# Patient Record
Sex: Female | Born: 1975 | Race: Black or African American | Hispanic: No | Marital: Single | Smoking: Former smoker
Health system: Southern US, Community
[De-identification: ages and names within clinical notes are randomized; demographics above are authoritative.]

## PROBLEM LIST (undated history)

## (undated) DIAGNOSIS — N920 Excessive and frequent menstruation with regular cycle: Secondary | ICD-10-CM

## (undated) DIAGNOSIS — IMO0001 Reserved for inherently not codable concepts without codable children: Secondary | ICD-10-CM

## (undated) DIAGNOSIS — Z8619 Personal history of other infectious and parasitic diseases: Secondary | ICD-10-CM

## (undated) DIAGNOSIS — D649 Anemia, unspecified: Secondary | ICD-10-CM

## (undated) DIAGNOSIS — Z973 Presence of spectacles and contact lenses: Secondary | ICD-10-CM

## (undated) DIAGNOSIS — N946 Dysmenorrhea, unspecified: Secondary | ICD-10-CM

## (undated) DIAGNOSIS — T7840XA Allergy, unspecified, initial encounter: Secondary | ICD-10-CM

## (undated) DIAGNOSIS — E039 Hypothyroidism, unspecified: Secondary | ICD-10-CM

## (undated) DIAGNOSIS — IMO0002 Reserved for concepts with insufficient information to code with codable children: Secondary | ICD-10-CM

## (undated) HISTORY — PX: THUMB ARTHROSCOPY: SHX2509

## (undated) HISTORY — DX: Allergy, unspecified, initial encounter: T78.40XA

## (undated) HISTORY — PX: TUBAL LIGATION: SHX77

## (undated) HISTORY — PX: FOOT SURGERY: SHX648

---

## 1998-08-23 ENCOUNTER — Encounter: Payer: Self-pay | Admitting: Emergency Medicine

## 1998-08-23 ENCOUNTER — Emergency Department (HOSPITAL_COMMUNITY): Admission: EM | Admit: 1998-08-23 | Discharge: 1998-08-23 | Payer: Self-pay | Admitting: Emergency Medicine

## 1999-08-14 ENCOUNTER — Emergency Department (HOSPITAL_COMMUNITY): Admission: EM | Admit: 1999-08-14 | Discharge: 1999-08-14 | Payer: Self-pay | Admitting: Emergency Medicine

## 1999-12-25 ENCOUNTER — Other Ambulatory Visit: Admission: RE | Admit: 1999-12-25 | Discharge: 1999-12-25 | Payer: Self-pay | Admitting: Family Medicine

## 2000-02-12 ENCOUNTER — Emergency Department (HOSPITAL_COMMUNITY): Admission: EM | Admit: 2000-02-12 | Discharge: 2000-02-12 | Payer: Self-pay | Admitting: *Deleted

## 2000-02-25 ENCOUNTER — Emergency Department (HOSPITAL_COMMUNITY): Admission: EM | Admit: 2000-02-25 | Discharge: 2000-02-25 | Payer: Self-pay | Admitting: Emergency Medicine

## 2000-05-11 ENCOUNTER — Emergency Department (HOSPITAL_COMMUNITY): Admission: EM | Admit: 2000-05-11 | Discharge: 2000-05-11 | Payer: Self-pay | Admitting: Emergency Medicine

## 2000-09-14 HISTORY — PX: FINGER SURGERY: SHX640

## 2001-02-08 ENCOUNTER — Emergency Department (HOSPITAL_COMMUNITY): Admission: EM | Admit: 2001-02-08 | Discharge: 2001-02-08 | Payer: Self-pay | Admitting: Emergency Medicine

## 2001-03-11 ENCOUNTER — Emergency Department (HOSPITAL_COMMUNITY): Admission: EM | Admit: 2001-03-11 | Discharge: 2001-03-11 | Payer: Self-pay | Admitting: Neurology

## 2001-03-22 ENCOUNTER — Emergency Department (HOSPITAL_COMMUNITY): Admission: EM | Admit: 2001-03-22 | Discharge: 2001-03-22 | Payer: Self-pay | Admitting: Emergency Medicine

## 2001-04-12 ENCOUNTER — Other Ambulatory Visit: Admission: RE | Admit: 2001-04-12 | Discharge: 2001-04-12 | Payer: Self-pay | Admitting: Family Medicine

## 2001-04-13 ENCOUNTER — Encounter: Admission: RE | Admit: 2001-04-13 | Discharge: 2001-05-06 | Payer: Self-pay | Admitting: Orthopedic Surgery

## 2001-12-06 ENCOUNTER — Other Ambulatory Visit: Admission: RE | Admit: 2001-12-06 | Discharge: 2001-12-06 | Payer: Self-pay | Admitting: Obstetrics and Gynecology

## 2002-06-13 ENCOUNTER — Encounter (HOSPITAL_COMMUNITY): Admission: AD | Admit: 2002-06-13 | Discharge: 2002-07-13 | Payer: Self-pay | Admitting: Obstetrics and Gynecology

## 2002-07-18 ENCOUNTER — Inpatient Hospital Stay (HOSPITAL_COMMUNITY): Admission: AD | Admit: 2002-07-18 | Discharge: 2002-07-19 | Payer: Self-pay | Admitting: Obstetrics and Gynecology

## 2002-08-16 ENCOUNTER — Other Ambulatory Visit: Admission: RE | Admit: 2002-08-16 | Discharge: 2002-08-16 | Payer: Self-pay | Admitting: Obstetrics and Gynecology

## 2002-09-13 ENCOUNTER — Inpatient Hospital Stay (HOSPITAL_COMMUNITY): Admission: AD | Admit: 2002-09-13 | Discharge: 2002-09-13 | Payer: Self-pay | Admitting: Obstetrics and Gynecology

## 2002-12-11 ENCOUNTER — Inpatient Hospital Stay (HOSPITAL_COMMUNITY): Admission: AD | Admit: 2002-12-11 | Discharge: 2002-12-11 | Payer: Self-pay | Admitting: Obstetrics and Gynecology

## 2003-03-26 ENCOUNTER — Inpatient Hospital Stay (HOSPITAL_COMMUNITY): Admission: AD | Admit: 2003-03-26 | Discharge: 2003-03-26 | Payer: Self-pay | Admitting: Obstetrics and Gynecology

## 2003-06-19 ENCOUNTER — Inpatient Hospital Stay (HOSPITAL_COMMUNITY): Admission: AD | Admit: 2003-06-19 | Discharge: 2003-06-19 | Payer: Self-pay | Admitting: Obstetrics and Gynecology

## 2003-09-15 DIAGNOSIS — Z8639 Personal history of other endocrine, nutritional and metabolic disease: Secondary | ICD-10-CM | POA: Insufficient documentation

## 2003-09-15 HISTORY — DX: Personal history of other endocrine, nutritional and metabolic disease: Z86.39

## 2003-09-17 ENCOUNTER — Encounter (HOSPITAL_COMMUNITY): Admission: RE | Admit: 2003-09-17 | Discharge: 2003-12-16 | Payer: Self-pay | Admitting: Family Medicine

## 2004-02-18 ENCOUNTER — Other Ambulatory Visit: Admission: RE | Admit: 2004-02-18 | Discharge: 2004-02-18 | Payer: Self-pay | Admitting: Obstetrics and Gynecology

## 2004-10-05 ENCOUNTER — Emergency Department (HOSPITAL_COMMUNITY): Admission: EM | Admit: 2004-10-05 | Discharge: 2004-10-05 | Payer: Self-pay | Admitting: Emergency Medicine

## 2004-11-07 ENCOUNTER — Emergency Department (HOSPITAL_COMMUNITY): Admission: EM | Admit: 2004-11-07 | Discharge: 2004-11-07 | Payer: Self-pay | Admitting: Emergency Medicine

## 2005-03-13 ENCOUNTER — Emergency Department (HOSPITAL_COMMUNITY): Admission: EM | Admit: 2005-03-13 | Discharge: 2005-03-14 | Payer: Self-pay | Admitting: Emergency Medicine

## 2005-06-25 ENCOUNTER — Ambulatory Visit (HOSPITAL_COMMUNITY): Admission: RE | Admit: 2005-06-25 | Discharge: 2005-06-25 | Payer: Self-pay | Admitting: Endocrinology

## 2005-08-10 ENCOUNTER — Emergency Department (HOSPITAL_COMMUNITY): Admission: EM | Admit: 2005-08-10 | Discharge: 2005-08-11 | Payer: Self-pay | Admitting: Emergency Medicine

## 2005-08-17 ENCOUNTER — Encounter: Admission: RE | Admit: 2005-08-17 | Discharge: 2005-10-27 | Payer: Self-pay | Admitting: Sports Medicine

## 2005-09-21 ENCOUNTER — Other Ambulatory Visit: Admission: RE | Admit: 2005-09-21 | Discharge: 2005-09-21 | Payer: Self-pay | Admitting: Obstetrics and Gynecology

## 2005-10-12 ENCOUNTER — Emergency Department (HOSPITAL_COMMUNITY): Admission: EM | Admit: 2005-10-12 | Discharge: 2005-10-12 | Payer: Self-pay | Admitting: Emergency Medicine

## 2005-10-15 DIAGNOSIS — E890 Postprocedural hypothyroidism: Secondary | ICD-10-CM | POA: Insufficient documentation

## 2005-10-15 HISTORY — DX: Postprocedural hypothyroidism: E89.0

## 2005-10-19 ENCOUNTER — Encounter (HOSPITAL_COMMUNITY): Admission: RE | Admit: 2005-10-19 | Discharge: 2006-01-17 | Payer: Self-pay | Admitting: Endocrinology

## 2005-12-01 ENCOUNTER — Emergency Department (HOSPITAL_COMMUNITY): Admission: EM | Admit: 2005-12-01 | Discharge: 2005-12-01 | Payer: Self-pay | Admitting: Emergency Medicine

## 2006-01-30 ENCOUNTER — Emergency Department (HOSPITAL_COMMUNITY): Admission: EM | Admit: 2006-01-30 | Discharge: 2006-01-30 | Payer: Self-pay | Admitting: Emergency Medicine

## 2008-10-06 ENCOUNTER — Emergency Department (HOSPITAL_COMMUNITY): Admission: EM | Admit: 2008-10-06 | Discharge: 2008-10-06 | Payer: Self-pay | Admitting: Emergency Medicine

## 2009-03-03 ENCOUNTER — Emergency Department (HOSPITAL_COMMUNITY): Admission: EM | Admit: 2009-03-03 | Discharge: 2009-03-03 | Payer: Self-pay | Admitting: Emergency Medicine

## 2009-06-06 ENCOUNTER — Other Ambulatory Visit: Admission: RE | Admit: 2009-06-06 | Discharge: 2009-06-06 | Payer: Self-pay | Admitting: Radiology

## 2010-04-10 ENCOUNTER — Emergency Department (HOSPITAL_COMMUNITY): Admission: EM | Admit: 2010-04-10 | Discharge: 2010-04-10 | Payer: Self-pay | Admitting: Emergency Medicine

## 2010-05-06 ENCOUNTER — Emergency Department (HOSPITAL_COMMUNITY): Admission: EM | Admit: 2010-05-06 | Discharge: 2010-05-06 | Payer: Self-pay | Admitting: Emergency Medicine

## 2010-07-20 ENCOUNTER — Inpatient Hospital Stay (HOSPITAL_COMMUNITY)
Admission: AD | Admit: 2010-07-20 | Discharge: 2010-07-20 | Payer: Self-pay | Source: Home / Self Care | Admitting: Obstetrics and Gynecology

## 2010-10-04 ENCOUNTER — Encounter: Payer: Self-pay | Admitting: Endocrinology

## 2010-11-10 ENCOUNTER — Inpatient Hospital Stay (HOSPITAL_COMMUNITY)
Admission: AD | Admit: 2010-11-10 | Discharge: 2010-11-11 | Disposition: A | Discharge status: Home / Self Care | Payer: Medicaid Other | Source: Ambulatory Visit | Attending: Obstetrics and Gynecology | Admitting: Obstetrics and Gynecology

## 2010-11-10 DIAGNOSIS — A499 Bacterial infection, unspecified: Secondary | ICD-10-CM | POA: Insufficient documentation

## 2010-11-10 DIAGNOSIS — O2390 Unspecified genitourinary tract infection in pregnancy, unspecified trimester: Secondary | ICD-10-CM | POA: Insufficient documentation

## 2010-11-10 DIAGNOSIS — B9689 Other specified bacterial agents as the cause of diseases classified elsewhere: Secondary | ICD-10-CM | POA: Insufficient documentation

## 2010-11-10 DIAGNOSIS — N760 Acute vaginitis: Secondary | ICD-10-CM | POA: Insufficient documentation

## 2010-11-10 DIAGNOSIS — O4700 False labor before 37 completed weeks of gestation, unspecified trimester: Secondary | ICD-10-CM | POA: Insufficient documentation

## 2010-11-11 LAB — URINALYSIS, ROUTINE W REFLEX MICROSCOPIC
Hgb urine dipstick: NEGATIVE
Ketones, ur: NEGATIVE mg/dL
Protein, ur: NEGATIVE mg/dL
Specific Gravity, Urine: <1.005 — ABNORMAL LOW (ref 1.005–1.030)
Urobilinogen, UA: 0.2 mg/dL (ref 0.0–1.0)

## 2010-11-11 LAB — WET PREP, GENITAL: Yeast Wet Prep HPF POC: NONE SEEN

## 2010-11-11 LAB — GC/CHLAMYDIA PROBE AMP, GENITAL: GC Probe Amp, Genital: NEGATIVE

## 2010-11-12 LAB — STREP B DNA PROBE: Strep Group B Ag: NEGATIVE

## 2010-11-28 LAB — GC/CHLAMYDIA PROBE AMP, GENITAL
Chlamydia, DNA Probe: NEGATIVE
GC Probe Amp, Genital: NEGATIVE

## 2010-11-28 LAB — DIFFERENTIAL
Basophils Absolute: 0.1 10*3/uL (ref 0.0–0.1)
Basophils Relative: 1 % (ref 0–1)
Eosinophils Absolute: 0.2 10*3/uL (ref 0.0–0.7)
Eosinophils Relative: 2 % (ref 0–5)
Lymphocytes Relative: 24 % (ref 12–46)
Lymphs Abs: 2 10*3/uL (ref 0.7–4.0)
Monocytes Absolute: 0.6 10*3/uL (ref 0.1–1.0)
Monocytes Relative: 7 % (ref 3–12)
Neutro Abs: 5.3 10*3/uL (ref 1.7–7.7)
Neutrophils Relative %: 66 % (ref 43–77)

## 2010-11-28 LAB — CBC
HCT: 35.1 % — ABNORMAL LOW (ref 36.0–46.0)
Hemoglobin: 12.3 g/dL (ref 12.0–15.0)
MCH: 34.3 pg — ABNORMAL HIGH (ref 26.0–34.0)
MCHC: 35 g/dL (ref 30.0–36.0)
MCV: 98.1 fL (ref 78.0–100.0)
Platelets: 216 10*3/uL (ref 150–400)
RBC: 3.58 MIL/uL — ABNORMAL LOW (ref 3.87–5.11)
RDW: 12 % (ref 11.5–15.5)
WBC: 8.1 10*3/uL (ref 4.0–10.5)

## 2010-11-28 LAB — WET PREP, GENITAL: Trich, Wet Prep: NONE SEEN

## 2010-11-28 LAB — ABO/RH: ABO/RH(D): O POS

## 2010-11-28 LAB — URINALYSIS, ROUTINE W REFLEX MICROSCOPIC
Bilirubin Urine: NEGATIVE
Glucose, UA: NEGATIVE mg/dL
Hgb urine dipstick: NEGATIVE
Ketones, ur: NEGATIVE mg/dL
Nitrite: NEGATIVE
Protein, ur: NEGATIVE mg/dL
Specific Gravity, Urine: 1.009 (ref 1.005–1.030)
Urobilinogen, UA: 0.2 mg/dL (ref 0.0–1.0)
pH: 7.5 (ref 5.0–8.0)

## 2010-11-28 LAB — POCT PREGNANCY, URINE: Preg Test, Ur: POSITIVE

## 2010-11-28 LAB — HCG, QUANTITATIVE, PREGNANCY: hCG, Beta Chain, Quant, S: 187820 m[IU]/mL — ABNORMAL HIGH (ref <5)

## 2010-11-29 LAB — POCT PREGNANCY, URINE: Preg Test, Ur: NEGATIVE

## 2010-11-29 LAB — GC/CHLAMYDIA PROBE AMP, GENITAL: Chlamydia, DNA Probe: NEGATIVE

## 2010-11-29 LAB — URINALYSIS, ROUTINE W REFLEX MICROSCOPIC
Bilirubin Urine: NEGATIVE
Nitrite: NEGATIVE
Specific Gravity, Urine: 1.023 (ref 1.005–1.030)
pH: 7.5 (ref 5.0–8.0)

## 2010-11-29 LAB — WET PREP, GENITAL: Yeast Wet Prep HPF POC: NONE SEEN

## 2010-12-19 ENCOUNTER — Inpatient Hospital Stay (HOSPITAL_COMMUNITY): Admission: AD | Admit: 2010-12-19 | Payer: Self-pay | Source: Home / Self Care | Admitting: Obstetrics & Gynecology

## 2010-12-22 ENCOUNTER — Other Ambulatory Visit: Payer: Self-pay

## 2010-12-22 ENCOUNTER — Inpatient Hospital Stay (HOSPITAL_COMMUNITY)
Admission: AD | Admit: 2010-12-22 | Discharge: 2010-12-24 | DRG: 374 | Disposition: A | Discharge status: Home / Self Care | Payer: BC Managed Care – PPO | Source: Ambulatory Visit | Attending: Obstetrics and Gynecology | Admitting: Obstetrics and Gynecology

## 2010-12-22 DIAGNOSIS — Z302 Encounter for sterilization: Secondary | ICD-10-CM

## 2010-12-22 HISTORY — PX: TUBAL LIGATION: SHX77

## 2010-12-22 LAB — CBC
HCT: 36.4 % (ref 36.0–46.0)
MCHC: 34.3 g/dL (ref 30.0–36.0)
MCV: 95.5 fL (ref 78.0–100.0)
RDW: 13.8 % (ref 11.5–15.5)

## 2010-12-23 LAB — CBC
MCH: 32.3 pg (ref 26.0–34.0)
MCHC: 33.2 g/dL (ref 30.0–36.0)
MCV: 97.1 fL (ref 78.0–100.0)
Platelets: 167 10*3/uL (ref 150–400)
RDW: 14 % (ref 11.5–15.5)

## 2011-01-22 NOTE — Op Note (Signed)
  NAMECAROLENA, Susan Schmitt               ACCOUNT NO.:  [id number]  MEDICAL RECORD NO.:  [id number]           PATIENT TYPE:  LOCATION:                                 FACILITY:  PHYSICIAN:  Janine Limbo, M.D.DATE OF BIRTH:  Aug 03, 1976  DATE OF PROCEDURE:  12/22/2010 DATE OF DISCHARGE:                              OPERATIVE REPORT   PREOPERATIVE DIAGNOSES: 1. Postpartum day 0. 2. Desires sterilization.  POSTOPERATIVE DIAGNOSES: 1. Postpartum day 0. 2. Desires sterilization.  PROCEDURE:  Postpartum bilateral tubal ligation.  SURGEON:  Janine Limbo, M.D.  FIRST ASSISTANT:  None.  ANESTHETIC:  Epidural.  DISPOSITION:  [name] is a 35 year old female, now para 3-0-5-3, who had a vaginal delivery of a healthy female infant today.  She desires permanent sterilization.  She understands the indications for her surgical procedure and she accepts the risks of, but not limited to, anesthetic complications, bleeding, infection, and possible damage to the surrounding organs.  She understands that there is a small but real failure rate associated with tubal sterilization (7 to 10 the patients per 1000).  FINDINGS:  The fallopian tubes were normal bilaterally.  There was some thinning noted at the umbilicus but no hernia was appreciated.  PROCEDURE:  The patient was taken to the operating room where it was determined that the epidural she had received for labor would be adequate for her tubal sterilization.  The patient's abdomen was prepped with multiple layers of ChloraPrep and then sterilely draped.  The subumbilical area was injected with 5 mL of 0.5% Marcaine with epinephrine.  A subumbilical incision was made and carried sharply through the subcutaneous tissue, the fascia, and the anterior peritoneum.  The left fallopian tube was identified and followed to its fimbriated end.  A knuckle of tube was made on the left using two free ties of 0 plain catgut suture.  The  knuckle of tube thus made was excised.  Hemostasis was adequate.  An identical procedure was carried out on the opposite side.  Again, hemostasis was adequate.  The instruments were removed.  The fascia and the anterior peritoneum were closed using a running suture of 2-0 Vicryl.  The skin was reapproximated using a subcuticular suture of 3-0 Monocryl.  Sponge, needle, and instrument counts were correct.  The estimated blood loss was 2 mL. The patient tolerated her procedure well.  She was transported to the recovery room in stable condition.  The portion of the left tube and a portion of the right tube were sent to Pathology for evaluation.     Janine Limbo, M.D.     AVS/MEDQ  D:  12/22/2010  T:  12/23/2010  Job:  [id number]  Electronically Signed by Kirkland Hun M.D. on 01/22/2011 03:12:09 AM

## 2011-01-22 NOTE — Discharge Summary (Signed)
[name], Schmitt               ACCOUNT NO.:  [id number]  MEDICAL RECORD NO.:  [id number]           PATIENT TYPE:  I  LOCATION:  9119                          FACILITY:  [facility]  PHYSICIAN:  Osborn Coho, M.D.   DATE OF BIRTH:  July 05, 1976  DATE OF ADMISSION:  12/22/2010 DATE OF DISCHARGE:  12/24/2010                              DISCHARGE SUMMARY   ADMITTING DIAGNOSES: 1. Intrauterine pregnancy at 40-3/7 weeks. 2. Group B strep negative. 3. History of herpes simplex virus II. 4. Desires tubal sterilization. 5. Multiple TABs.  DISCHARGE DIAGNOSES: 1. Intrauterine pregnancy at 40-3/7 weeks. 2. Group B strep negative. 3. History of herpes simplex virus II. 4. Desires tubal sterilization. 5. Multiple TABs.  PROCEDURES: 1. Normal spontaneous vaginal delivery. 2. Postpartum tubal sterilization.  HOSPITAL COURSE:  [name] is a 35 year old, gravida 8, para 2-0-5-2 who presented 40-3/7 weeks early on the morning of December 22, 2010, with contractions approximately 2 hours.  Her pregnancy had been remarkable for: 1. Multiple TABs. 2. Thyroid disease with the patient on Synthroid 3. History of HSV II with no recent or current lesions. 4. First trimester spotting. 5. Group B strep negative. 6. Desires tubal sterilization with consent on chart.  On arrival, cervix was 3, 80% vertex -1.  No HSV lesions or prodrome were noted.  The patient planned an epidural.  This was placed.  The patient had a 9-minute deceleration subsequent to an episode of  tachysystole.  At that time, she was 5, 90% vertex, -1 with bulging bag of water.  Fetal heart rate resolved to a normal pattern.  Pitocin was begun secondary to inadequate labor pattern.  She then became completely dilated at 9:38, pushing began at 9:45, and she delivered at 10:12.  She had a viable female by the name of Susan Schmitt.  Weight 7 pounds and 14 ounces, Apgars were 7 and 9.  Infant was taken to the Full-Term Nursery.  Mother was  taken to recovery in good condition.  There were no lacerations noted.  There was some bogginess of the uterus, so Cytotec 1000 mcg was placed in the rectum.  Placenta was also sent to Pathology for a questionable velamentous insertion.  Dr. Stefano Gaul then saw the patient subsequent to her delivery, and the decision was made to proceed with tubal sterilization. This was done on December 22, 2010, by Dr. Stefano Gaul under existing epidural anesthesia.  The patient tolerated the procedure well and was taken to recovery in good condition.  On postpartum day #1, postop day #1, the patient was doing well.  She was up ad lib, tolerating a regular diet.  Hemoglobin was 11.1, down from 12.5.  White blood cell count was 10.5 and platelet count was 167.  Her incision was clean, dry, and intact.  She was breast-feeding well.  By the next day, she was continuing to do well.  She was up ad lib.  Baby was feeding well.  The patient's incision was within normal limits.  She was deemed to receive full benefit of hospital stay and was discharged home.  DISCHARGE INSTRUCTIONS:  Per [facility] handout.  DISCHARGE MEDICATIONS: 1. Motrin 600 mg p.o. q.6 h. p.r.n. pain. 2. Percocet 5/325 one to two p.o. q.3-4 h. p.r.n. pain.  DISCHARGE FOLLOWUP:  Will occur in 6 weeks at [facility].     Susan Schmitt Susan Schmitt, C.N.M.   ______________________________ Osborn Coho, M.D.    VLL/MEDQ  D:  12/26/2010  T:  12/27/2010  Job:  [id number]  Electronically Signed by Nigel Bridgeman C.N.M. on 12/27/2010 11:06:13 AM Electronically Signed by Osborn Coho M.D. on 01/22/2011 07:38:33 AM

## 2011-09-05 ENCOUNTER — Emergency Department (HOSPITAL_COMMUNITY)
Admission: EM | Admit: 2011-09-05 | Discharge: 2011-09-05 | Disposition: A | Discharge status: Home / Self Care | Payer: Medicaid Other | Attending: Emergency Medicine | Admitting: Emergency Medicine

## 2011-09-05 DIAGNOSIS — Z0000 Encounter for general adult medical examination without abnormal findings: Secondary | ICD-10-CM

## 2011-09-05 HISTORY — DX: Reserved for inherently not codable concepts without codable children: IMO0001

## 2011-09-05 HISTORY — DX: Hypothyroidism, unspecified: E03.9

## 2011-09-05 HISTORY — DX: Reserved for concepts with insufficient information to code with codable children: IMO0002

## 2011-09-05 NOTE — ED Notes (Signed)
Pt requesting ETOH level. Pt has breathalyzer in vehicle which registered etoh on board today at 3:40pm.  Pt states has not drank in over 1 year.  Pt  Wants etoh to show negative.  Went to police station and they referred to ER for testing.

## 2011-09-05 NOTE — ED Provider Notes (Signed)
History     CSN: [id number]  Arrival date & time 09/05/11  1825   First MD Initiated Contact with Patient 09/05/11 1945      Chief Complaint  Patient presents with  . Labs Only    (Consider location/radiation/quality/duration/timing/severity/associated sxs/prior treatment) The history is provided by the patient.  Pt is a 35yo female who is here with a complaint of an abnormal lab. Pt states she has abreathalyzer in her vihicle for prevous DUI. States she was out running errands when she blew positive for alcohol. Pt denies drinking. States she has not had a sip of alcohol in over a year. States that later she blew negative. States that she went to a police station and they told her to come here to have alcohol tested. Pt has no other complaints.   Past Medical History  Diagnosis Date  . Hypothyroid   . Radiation     thyroid    Past Surgical History  Procedure Date  . Thumb arthroscopy   . Tubal ligation     History reviewed. No pertinent family history.  History  Substance Use Topics  . Smoking status: Former Games developer  . Smokeless tobacco: Not on file  . Alcohol Use: No    OB History    Grav Para Term Preterm Abortions TAB SAB Ect Mult Living                  Review of Systems  All other systems reviewed and are negative.    Allergies  Review of patient's allergies indicates no known allergies.  Home Medications   Current Outpatient Rx  Name Route Sig Dispense Refill  . LEVOTHYROXINE SODIUM 100 MCG PO TABS Oral Take 100 mcg by mouth daily.      Marland Kitchen HAIR/SKIN/NAILS PO Oral Take 1 tablet by mouth daily.      . TERBINAFINE HCL 250 MG PO TABS Oral Take 250 mg by mouth 2 (two) times daily.        BP 113/80  Pulse 82  Temp(Src) 98.7 F (37.1 C) (Oral)  Resp 18  SpO2 100%  LMP 08/03/2011  Physical Exam  Constitutional: She is oriented to person, place, and time. She appears well-developed and well-nourished. No distress.  HENT:  Head:  Normocephalic.  Neck: Neck supple.  Cardiovascular: Normal rate, regular rhythm and normal heart sounds.   Pulmonary/Chest: Effort normal and breath sounds normal. No respiratory distress.  Neurological: She is alert and oriented to person, place, and time.  Skin: Skin is warm and dry.  Psychiatric: She has a normal mood and affect. Her behavior is normal. Judgment and thought content normal.    ED Course  Procedures (including critical care time)  Pt's alcohol here is negative. No sure how beneficial thsi would be since she blew it positivie around 4pm, and it is 10pm now. Will d/c home with results.   MDM          Lottie Mussel, PA 09/05/11 2209

## 2011-09-06 NOTE — ED Provider Notes (Signed)
Medical screening examination/treatment/procedure(s) were performed by non-physician practitioner and as supervising physician I was immediately available for consultation/collaboration.   Lyanne Co, MD 09/06/11 0000

## 2013-05-17 ENCOUNTER — Encounter (HOSPITAL_COMMUNITY): Payer: Self-pay

## 2013-05-17 ENCOUNTER — Emergency Department (HOSPITAL_COMMUNITY)
Admission: EM | Admit: 2013-05-17 | Discharge: 2013-05-18 | Disposition: A | Discharge status: Home / Self Care | Payer: BC Managed Care – PPO | Attending: Emergency Medicine | Admitting: Emergency Medicine

## 2013-05-17 DIAGNOSIS — Z3202 Encounter for pregnancy test, result negative: Secondary | ICD-10-CM | POA: Insufficient documentation

## 2013-05-17 DIAGNOSIS — R1032 Left lower quadrant pain: Secondary | ICD-10-CM | POA: Insufficient documentation

## 2013-05-17 DIAGNOSIS — N898 Other specified noninflammatory disorders of vagina: Secondary | ICD-10-CM | POA: Insufficient documentation

## 2013-05-17 DIAGNOSIS — E039 Hypothyroidism, unspecified: Secondary | ICD-10-CM | POA: Insufficient documentation

## 2013-05-17 DIAGNOSIS — N949 Unspecified condition associated with female genital organs and menstrual cycle: Secondary | ICD-10-CM | POA: Insufficient documentation

## 2013-05-17 DIAGNOSIS — R110 Nausea: Secondary | ICD-10-CM | POA: Insufficient documentation

## 2013-05-17 DIAGNOSIS — Z87891 Personal history of nicotine dependence: Secondary | ICD-10-CM | POA: Insufficient documentation

## 2013-05-17 DIAGNOSIS — Z79899 Other long term (current) drug therapy: Secondary | ICD-10-CM | POA: Insufficient documentation

## 2013-05-17 DIAGNOSIS — R109 Unspecified abdominal pain: Secondary | ICD-10-CM

## 2013-05-17 LAB — CBC WITH DIFFERENTIAL/PLATELET
Basophils Absolute: 0 10*3/uL (ref 0.0–0.1)
Eosinophils Absolute: 0.2 10*3/uL (ref 0.0–0.7)
Eosinophils Relative: 3 % (ref 0–5)
Lymphs Abs: 3.6 10*3/uL (ref 0.7–4.0)
MCH: 31.3 pg (ref 26.0–34.0)
MCV: 93 fL (ref 78.0–100.0)
Monocytes Absolute: 0.8 10*3/uL (ref 0.1–1.0)
Platelets: 192 10*3/uL (ref 150–400)
RDW: 12 % (ref 11.5–15.5)

## 2013-05-17 LAB — URINALYSIS, ROUTINE W REFLEX MICROSCOPIC
Bilirubin Urine: NEGATIVE
Hgb urine dipstick: NEGATIVE
Protein, ur: NEGATIVE mg/dL
Urobilinogen, UA: 1 mg/dL (ref 0.0–1.0)

## 2013-05-17 MED ORDER — ACETAMINOPHEN 325 MG PO TABS
650.0000 mg | ORAL_TABLET | Freq: Once | ORAL | Status: AC
  Administered 2013-05-18: 650 mg via ORAL
  Filled 2013-05-17: qty 1

## 2013-05-17 NOTE — ED Provider Notes (Signed)
CSN: [id number]     Arrival date & time 05/17/13  2210 History   This chart was scribed for Rhea Bleacher, PA, working with Audree Camel, MD by Blanchard Kelch, ED Scribe. This patient was seen in room WA15/WA15 and the patient's care was started at 11:28 PM.     Chief Complaint  Patient presents with  . Abdominal Pain    Patient is a 37 y.o. female presenting with abdominal pain. The history is provided by the patient. No language interpreter was used.  Abdominal Pain Associated symptoms: nausea   Associated symptoms: no chest pain, no cough, no diarrhea, no dysuria, no fever, no sore throat, no vaginal bleeding, no vaginal discharge and no vomiting     HPI Comments: Susan Schmitt is a 37 y.o. female who presents to the Emergency Department complaining of constant, worsening left lower abdominal pain that began a few days ago. Patient reports her husband was cheating on her previously and she claims to have caught an STD due to the presence of vaginal discharge and an odor before the abdominal pain began. Patient was given medicine to treat bacterial vaginosis but she wasn't tested for STD and a pelvic exam was not performed. The patient reports the symptoms cleared up after taking the prescribed medication. She denies fever, nausea, vomiting, dysuria, or current vaginal discharge. She denies taking any medications to relieve the pain. She has not had any prior surgeries to the abdomen. The onset of this condition was acute. The course is constant. Aggravating factors: none. Alleviating factors: none.      Past Medical History  Diagnosis Date  . Hypothyroid   . Radiation     thyroid   Past Surgical History  Procedure Laterality Date  . Thumb arthroscopy    . Tubal ligation     History reviewed. No pertinent family history. History  Substance Use Topics  . Smoking status: Former Games developer  . Smokeless tobacco: Not on file  . Alcohol Use: No   OB History   Grav Para Term Preterm  Abortions TAB SAB Ect Mult Living                 Review of Systems  Constitutional: Negative for fever.  HENT: Negative for sore throat and rhinorrhea.   Eyes: Negative for redness.  Respiratory: Negative for cough.   Cardiovascular: Negative for chest pain.  Gastrointestinal: Positive for nausea and abdominal pain. Negative for vomiting and diarrhea.  Genitourinary: Positive for pelvic pain. Negative for dysuria, vaginal bleeding and vaginal discharge.  Musculoskeletal: Negative for myalgias.  Skin: Negative for rash.  Neurological: Negative for headaches.    Allergies  Review of patient's allergies indicates no known allergies.  Home Medications   Current Outpatient Rx  Name  Route  Sig  Dispense  Refill  . levothyroxine (SYNTHROID, LEVOTHROID) 100 MCG tablet   Oral   Take 100 mcg by mouth daily.           . Multiple Vitamins-Minerals (HAIR/SKIN/NAILS PO)   Oral   Take 1 tablet by mouth daily.           Marland Kitchen terbinafine (LAMISIL) 250 MG tablet   Oral   Take 250 mg by mouth 2 (two) times daily.            Triage Vitals: BP 128/86  Pulse 80  Temp(Src) 98.4 F (36.9 C) (Oral)  Resp 20  Ht 5\' 5"  (1.651 m)  Wt 163 lb (73.936 kg)  BMI  27.12 kg/m2  SpO2 100%  LMP 05/08/2013  Physical Exam  Nursing note and vitals reviewed. Constitutional: She appears well-developed and well-nourished.  HENT:  Head: Normocephalic and atraumatic.  Eyes: Conjunctivae are normal. Right eye exhibits no discharge. Left eye exhibits no discharge.  Neck: Normal range of motion. Neck supple.  Cardiovascular: Normal rate, regular rhythm and normal heart sounds.   Pulmonary/Chest: Effort normal and breath sounds normal.  Abdominal: Soft. There is tenderness in the suprapubic area and left lower quadrant. There is no rebound and no guarding.  Genitourinary: There is no rash or tenderness on the right labia. There is no rash or tenderness on the left labia. Uterus is not tender. Cervix  exhibits motion tenderness (mild) and discharge (white). Right adnexum displays no mass and no tenderness. Left adnexum displays no mass and no tenderness. No tenderness around the vagina. Vaginal discharge (thick white) found.  Neurological: She is alert.  Skin: Skin is warm and dry.  Psychiatric: She has a normal mood and affect.    ED Course  Procedures (including critical care time)  DIAGNOSTIC STUDIES: Oxygen Saturation is 100% on room air, normal by my interpretation.    COORDINATION OF CARE:  11:37 PM - Patient verbalizes understanding and agrees with treatment plan.    Labs Review Labs Reviewed  WET PREP, GENITAL - Abnormal; Notable for the following:    WBC, Wet Prep HPF POC FEW (*)    All other components within normal limits  CBC WITH DIFFERENTIAL - Abnormal; Notable for the following:    RBC 3.74 (*)    Hemoglobin 11.7 (*)    HCT 34.8 (*)    All other components within normal limits  GC/CHLAMYDIA PROBE AMP  URINALYSIS, ROUTINE W REFLEX MICROSCOPIC  BASIC METABOLIC PANEL  POCT PREGNANCY, URINE   Imaging Review No results found.  Patient seen and examined. Work-up initiated. Medications ordered.   Vital signs reviewed and are as follows: Filed Vitals:   05/17/13 2219  BP: 128/86  Pulse: 80  Temp: 98.4 F (36.9 C)  Resp: 20   12:56 AM  Pelvic exam performed with tech chaperone. Pt informed of all results.   Offered to treat presumptively for GC/chlamydia and she accepts. Rocephin and azithro given.  Patient counseled on safe sexual practices.  Told them that they should not have sexual contact for next 7 days and that they need to inform sexual partners so that they can get tested and treated as well.  Urged f/u with [place] [facility] for HIV and syphilis testing.  Patient verbalizes understanding and agrees with plan.    Pt's pain is improved. Offered medications for home and she declines.   The patient was urged to return to the Emergency  Department immediately with worsening of current symptoms, worsening abdominal pain, persistent vomiting, blood noted in stools, fever, or any other concerns. The patient verbalized understanding.   Encouraged GYN f/u -- referral given.     MDM   1. Abdominal pain    Patient with abdominal pain. Labs reassuring. Patient at high risk for STD exposure. She was tested and treated for [facility] and Chlamydia. Do not suspect PID given minimal tenderness on exam. Few white blood cells on prep. Do not suspect UA or ovarian torsion. Do not feel pelvic US/CT indicated at this point. Pt appears well and non-toxic.    I personally performed the services described in this documentation, which was scribed in my presence. The recorded information has been reviewed  and is accurate.    Renne Crigler, PA-C 05/18/13 [phone]

## 2013-05-17 NOTE — ED Notes (Signed)
Pt finished treatment on Sunday for potential STD.  Pt symptoms went away.  Pt has been spotting/bleeding for over a week.  Now with abdominal pain to lower abdomen.  Nausea no vomiting no diarrhea.  No fever.

## 2013-05-18 LAB — WET PREP, GENITAL: Clue Cells Wet Prep HPF POC: NONE SEEN

## 2013-05-18 LAB — BASIC METABOLIC PANEL
Calcium: 8.9 mg/dL (ref 8.4–10.5)
Creatinine, Ser: 0.59 mg/dL (ref 0.50–1.10)
GFR calc non Af Amer: >90 mL/min (ref >90)
Glucose, Bld: 99 mg/dL (ref 70–99)
Sodium: 137 mEq/L (ref 135–145)

## 2013-05-18 LAB — GC/CHLAMYDIA PROBE AMP: CT Probe RNA: NEGATIVE

## 2013-05-18 MED ORDER — LIDOCAINE HCL 2 % IJ SOLN
INTRAMUSCULAR | Status: AC
  Filled 2013-05-18: qty 20

## 2013-05-18 MED ORDER — AZITHROMYCIN 250 MG PO TABS
1000.0000 mg | ORAL_TABLET | Freq: Once | ORAL | Status: AC
  Administered 2013-05-18: 1000 mg via ORAL
  Filled 2013-05-18: qty 4

## 2013-05-18 MED ORDER — CEFTRIAXONE SODIUM 250 MG IJ SOLR
250.0000 mg | Freq: Once | INTRAMUSCULAR | Status: AC
  Administered 2013-05-18: 250 mg via INTRAMUSCULAR
  Filled 2013-05-18: qty 250

## 2013-05-18 NOTE — ED Provider Notes (Signed)
Medical screening examination/treatment/procedure(s) were performed by non-physician practitioner and as supervising physician I was immediately available for consultation/collaboration.   Audree Camel, MD 05/18/13 1534

## 2015-04-19 ENCOUNTER — Emergency Department (HOSPITAL_COMMUNITY)
Admission: EM | Admit: 2015-04-19 | Discharge: 2015-04-19 | Disposition: A | Discharge status: Home / Self Care | Payer: Medicaid Other | Attending: Emergency Medicine | Admitting: Emergency Medicine

## 2015-04-19 ENCOUNTER — Encounter (HOSPITAL_COMMUNITY): Payer: Self-pay | Admitting: *Deleted

## 2015-04-19 ENCOUNTER — Emergency Department (HOSPITAL_COMMUNITY): Payer: Medicaid Other

## 2015-04-19 DIAGNOSIS — R0789 Other chest pain: Secondary | ICD-10-CM

## 2015-04-19 DIAGNOSIS — R519 Headache, unspecified: Secondary | ICD-10-CM

## 2015-04-19 DIAGNOSIS — Z3202 Encounter for pregnancy test, result negative: Secondary | ICD-10-CM | POA: Insufficient documentation

## 2015-04-19 DIAGNOSIS — Z87891 Personal history of nicotine dependence: Secondary | ICD-10-CM | POA: Insufficient documentation

## 2015-04-19 DIAGNOSIS — E039 Hypothyroidism, unspecified: Secondary | ICD-10-CM | POA: Insufficient documentation

## 2015-04-19 DIAGNOSIS — Z79899 Other long term (current) drug therapy: Secondary | ICD-10-CM | POA: Insufficient documentation

## 2015-04-19 DIAGNOSIS — M791 Myalgia, unspecified site: Secondary | ICD-10-CM

## 2015-04-19 DIAGNOSIS — R51 Headache: Secondary | ICD-10-CM | POA: Insufficient documentation

## 2015-04-19 LAB — BASIC METABOLIC PANEL
ANION GAP: 9 (ref 5–15)
BUN: 8 mg/dL (ref 6–20)
CALCIUM: 9 mg/dL (ref 8.9–10.3)
CHLORIDE: 105 mmol/L (ref 101–111)
CO2: 23 mmol/L (ref 22–32)
CREATININE: 0.63 mg/dL (ref 0.44–1.00)
GFR calc Af Amer: >60 mL/min (ref >60)
GFR calc non Af Amer: >60 mL/min (ref >60)
Glucose, Bld: 87 mg/dL (ref 65–99)
Potassium: 4.3 mmol/L (ref 3.5–5.1)
SODIUM: 137 mmol/L (ref 135–145)

## 2015-04-19 LAB — CBC
HEMATOCRIT: 37 % (ref 36.0–46.0)
HEMOGLOBIN: 12.6 g/dL (ref 12.0–15.0)
MCH: 31.7 pg (ref 26.0–34.0)
MCHC: 34.1 g/dL (ref 30.0–36.0)
MCV: 93 fL (ref 78.0–100.0)
Platelets: 204 10*3/uL (ref 150–400)
RBC: 3.98 MIL/uL (ref 3.87–5.11)
RDW: 12 % (ref 11.5–15.5)
WBC: 8.8 10*3/uL (ref 4.0–10.5)

## 2015-04-19 LAB — I-STAT BETA HCG BLOOD, ED (MC, WL, AP ONLY): I-stat hCG, quantitative: <5 m[IU]/mL (ref <5)

## 2015-04-19 LAB — TROPONIN I: Troponin I: <0.03 ng/mL (ref <0.031)

## 2015-04-19 MED ORDER — KETOROLAC TROMETHAMINE 30 MG/ML IJ SOLN
30.0000 mg | Freq: Once | INTRAMUSCULAR | Status: AC
  Administered 2015-04-19: 30 mg via INTRAVENOUS
  Filled 2015-04-19: qty 1

## 2015-04-19 MED ORDER — ONDANSETRON HCL 4 MG/2ML IJ SOLN
4.0000 mg | Freq: Once | INTRAMUSCULAR | Status: AC
  Administered 2015-04-19: 4 mg via INTRAVENOUS
  Filled 2015-04-19: qty 2

## 2015-04-19 MED ORDER — SODIUM CHLORIDE 0.9 % IV BOLUS (SEPSIS)
1000.0000 mL | Freq: Once | INTRAVENOUS | Status: AC
  Administered 2015-04-19: 1000 mL via INTRAVENOUS

## 2015-04-19 MED ORDER — PROCHLORPERAZINE EDISYLATE 5 MG/ML IJ SOLN
10.0000 mg | Freq: Once | INTRAMUSCULAR | Status: AC
  Administered 2015-04-19: 10 mg via INTRAVENOUS
  Filled 2015-04-19: qty 2

## 2015-04-19 MED ORDER — DEXAMETHASONE SODIUM PHOSPHATE 10 MG/ML IJ SOLN
10.0000 mg | Freq: Once | INTRAMUSCULAR | Status: AC
  Administered 2015-04-19: 10 mg via INTRAVENOUS
  Filled 2015-04-19: qty 1

## 2015-04-19 MED ORDER — MORPHINE SULFATE 4 MG/ML IJ SOLN
4.0000 mg | Freq: Once | INTRAMUSCULAR | Status: AC
  Administered 2015-04-19: 4 mg via INTRAVENOUS
  Filled 2015-04-19: qty 1

## 2015-04-19 MED ORDER — DIPHENHYDRAMINE HCL 50 MG/ML IJ SOLN
25.0000 mg | Freq: Once | INTRAMUSCULAR | Status: AC
  Administered 2015-04-19: 25 mg via INTRAVENOUS
  Filled 2015-04-19: qty 1

## 2015-04-19 MED ORDER — DOXYCYCLINE HYCLATE 100 MG PO CAPS: 100.0000 mg | ORAL_CAPSULE | Freq: Two times a day (BID) | ORAL | Status: DC

## 2015-04-19 NOTE — ED Notes (Signed)
Pt states uri and post neck pain from Sun to Wed.  Wed she woke up with L sided chest pain that increases with inspiration.

## 2015-04-19 NOTE — ED Provider Notes (Signed)
CSN: [id number]     Arrival date & time 04/19/15  1331 History   First MD Initiated Contact with Patient 04/19/15 1659     Chief Complaint  Patient presents with  . Chest Pain     (Consider location/radiation/quality/duration/timing/severity/associated sxs/prior Treatment) HPI   Susan Schmitt This is a 39 year old female who presents the emergency department for evaluation of headache, neck and chest pain. Patient states that one week ago she developed fever, chills, nausea, vomiting. This lasted possibly 3 days. On the fourth day she woke up feeling better. However, she started noting pain in her chest which is worse with movement of her neck. She has had a constant headache but denies photophobia, phonophobia, nausea, vomiting, rashes. She has endorsed myalgia, and arthralgias. She denies any exposure to tick bites. She denies cough or other symptoms of URI. She denies pleuritic type pain which differs from the intake noted on the patient. She does not smoke. She denies any unilateral leg swelling, exogenous estrogens or recent confinement.  Past Medical History  Diagnosis Date  . Hypothyroid   . Radiation     thyroid   Past Surgical History  Procedure Laterality Date  . Thumb arthroscopy    . Tubal ligation     No family history on file. History  Substance Use Topics  . Smoking status: Former Games developer  . Smokeless tobacco: Not on file  . Alcohol Use: No   OB History    No data available     Review of Systems Ten systems reviewed and are negative for acute change, except as noted in the HPI.     Allergies  Review of patient's allergies indicates no known allergies.  Home Medications   Prior to Admission medications   Medication Sig Start Date End Date Taking? Authorizing Provider  levothyroxine (SYNTHROID, LEVOTHROID) 100 MCG tablet Take 100 mcg by mouth daily.     Yes Historical Provider, MD  metroNIDAZOLE (FLAGYL) 500 MG tablet Take 500 mg by mouth 2 (two) times  daily. 04/16/15  Yes Historical Provider, MD   BP 122/73 mmHg  Pulse 62  Temp(Src) 99.4 F (37.4 C) (Oral)  Resp 23  Ht 5\' 5"  (1.651 m)  Wt 150 lb (68.04 kg)  BMI 24.96 kg/m2  SpO2 100%  LMP 03/31/2015 Physical Exam  Constitutional: She is oriented to person, place, and time. She appears well-developed and well-nourished. No distress.  HENT:  Head: Normocephalic and atraumatic.  Mouth/Throat: Oropharynx is clear and moist.  Eyes: Conjunctivae and EOM are normal. Pupils are equal, round, and reactive to light. No scleral icterus.  No horizontal, vertical or rotational nystagmus  Neck: Normal range of motion. Neck supple.  Full active and passive ROM without pain No midline or paraspinal tenderness No nuchal rigidity or meningeal signs  Cardiovascular: Normal rate, regular rhythm, normal heart sounds and intact distal pulses.  Exam reveals no gallop and no friction rub.   No murmur heard. Pulmonary/Chest: Effort normal and breath sounds normal. No respiratory distress. She has no wheezes. She has no rales.  Abdominal: Soft. Bowel sounds are normal. She exhibits no distension and no mass. There is no tenderness. There is no rebound and no guarding.  Musculoskeletal: Normal range of motion.  Lymphadenopathy:    She has no cervical adenopathy.  Neurological: She is alert and oriented to person, place, and time. She has normal reflexes. No cranial nerve deficit. She exhibits normal muscle tone. Coordination normal.  Mental Status:  Alert, oriented, thought content  appropriate. Speech fluent without evidence of aphasia. Able to follow 2 step commands without difficulty.  Cranial Nerves:  II:  Peripheral visual fields grossly normal, pupils equal, round, reactive to light III,IV, VI: ptosis not present, extra-ocular motions intact bilaterally  V,VII: smile symmetric, facial light touch sensation equal VIII: hearing grossly normal bilaterally  IX,X: midline uvula rise  XI: bilateral  shoulder shrug equal and strong XII: midline tongue extension  Motor:  5/5 in upper and lower extremities bilaterally including strong and equal grip strength and dorsiflexion/plantar flexion Sensory: Pinprick and light touch normal in all extremities.  Deep Tendon Reflexes: 2+ and symmetric  Cerebellar: normal finger-to-nose with bilateral upper extremities Gait: normal gait and balance CV: distal pulses palpable throughout   Skin: Skin is warm and dry. No rash noted. She is not diaphoretic.  Psychiatric: She has a normal mood and affect. Her behavior is normal. Judgment and thought content normal.  Nursing note and vitals reviewed.   ED Course  Procedures (including critical care time) Labs Review Labs Reviewed  BASIC METABOLIC PANEL  TROPONIN I  CBC  I-STAT BETA HCG BLOOD, ED ([facility], [facility], [facility] ONLY)    Imaging Review Dg Chest 2 View  04/19/2015   CLINICAL DATA:  Left upper chest pain.  Pain with inspiration.  EXAM: CHEST - 2 VIEW  COMPARISON:  None.  FINDINGS: The heart size and mediastinal contours are within normal limits. Both lungs are clear. The visualized skeletal structures are unremarkable.  IMPRESSION: Negative two view chest x-ray   Electronically Signed   By: Marin Roberts M.D.   On: 04/19/2015 14:16     EKG Interpretation   Date/Time:  Friday April 19 2015 13:39:30 EDT Ventricular Rate:  72 PR Interval:  186 QRS Duration: 72 QT Interval:  382 QTC Calculation: 418 R Axis:   70 Text Interpretation:  Normal sinus rhythm Normal ECG Confirmed by LITTLE  MD, RACHEL ([id number]) on 04/19/2015 7:24:11 PM      MDM   Final diagnoses:  Bad headache  Chest wall pain  Myalgia    Patient with flulike symptoms, headache and neck pain. herlabs are reassuring. No leukocytosis, no fever. EKG normal, normal chest x-ray. Patient has full range of motion of her neck without signs of meningismus and I doubt very highly her neck pain and flulike symptoms represent  meningitis. The patient will be discharged after interventions, which have improved her headache and myalgias. She will be given doxycycline to call her for possibility of tick borne illness as she has flulike symptoms in the summer. She is stable for discharge at this time. Follow-up with primary care   Arthor Captain, PA-C 04/20/15 0057  Laurence Spates, MD 04/20/15 [phone]

## 2015-04-19 NOTE — Discharge Instructions (Signed)
You are having a headache. No specific cause was found today for your headache. It may have been a migraine or other cause of headache. Stress, anxiety, fatigue, and depression are common triggers for headaches. Your headache today does not appear to be life-threatening or require hospitalization, but often the exact cause of headaches is not determined in the emergency department. Therefore, follow-up with your doctor is very important to find out what may have caused your headache, and whether or not you need any further diagnostic testing or treatment. Sometimes headaches can appear benign (not harmful), but then more serious symptoms can develop which should prompt an immediate re-evaluation by your doctor or the emergency department. SEEK MEDICAL ATTENTION IF: You develop possible problems with medications prescribed.  The medications don't resolve your headache, if it recurs , or if you have multiple episodes of vomiting or can't take fluids. You have a change from the usual headache. RETURN IMMEDIATELY IF you develop a sudden, severe headache or confusion, become poorly responsive or faint, develop a fever above 100.22F or problem breathing, have a change in speech, vision, swallowing, or understanding, or develop new weakness, numbness, tingling, incoordination, or have a seizure.    Viral Infections A viral infection can be caused by different types of viruses.Most viral infections are not serious and resolve on their own. However, some infections may cause severe symptoms and may lead to further complications. SYMPTOMS Viruses can frequently cause:  Minor sore throat.  Aches and pains.  Headaches.  Runny nose.  Different types of rashes.  Watery eyes.  Tiredness.  Cough.  Loss of appetite.  Gastrointestinal infections, resulting in nausea, vomiting, and diarrhea. These symptoms do not respond to antibiotics because the infection is not caused by bacteria. However, you might  catch a bacterial infection following the viral infection. This is sometimes called a "superinfection." Symptoms of such a bacterial infection may include:  Worsening sore throat with pus and difficulty swallowing.  Swollen neck glands.  Chills and a high or persistent fever.  Severe headache.  Tenderness over the sinuses.  Persistent overall ill feeling (malaise), muscle aches, and tiredness (fatigue).  Persistent cough.  Yellow, green, or brown mucus production with coughing. HOME CARE INSTRUCTIONS   Only take over-the-counter or prescription medicines for pain, discomfort, diarrhea, or fever as directed by your caregiver.  Drink enough water and fluids to keep your urine clear or pale yellow. Sports drinks can provide valuable electrolytes, sugars, and hydration.  Get plenty of rest and maintain proper nutrition. Soups and broths with crackers or rice are fine. SEEK IMMEDIATE MEDICAL CARE IF:   You have severe headaches, shortness of breath, chest pain, neck pain, or an unusual rash.  You have uncontrolled vomiting, diarrhea, or you are unable to keep down fluids.  You or your child has an oral temperature above 102 F (38.9 C), not controlled by medicine.  Your baby is older than 3 months with a rectal temperature of 102 F (38.9 C) or higher.  Your baby is 25 months old or younger with a rectal temperature of 100.4 F (38 C) or higher. MAKE SURE YOU:   Understand these instructions.  Will watch your condition.  Will get help right away if you are not doing well or get worse. Document Released: 06/10/2005 Document Revised: 11/23/2011 Document Reviewed: 01/05/2011 [facility] Patient Information 2015 [place], [place]. This information is not intended to replace advice given to you by your health care provider. Make sure you discuss any questions you  have with your health care provider. ° °

## 2015-07-18 ENCOUNTER — Encounter (HOSPITAL_COMMUNITY): Payer: Self-pay | Admitting: Emergency Medicine

## 2015-07-18 ENCOUNTER — Emergency Department (HOSPITAL_COMMUNITY)
Admission: EM | Admit: 2015-07-18 | Discharge: 2015-07-18 | Disposition: A | Discharge status: Home / Self Care | Payer: BLUE CROSS/BLUE SHIELD | Attending: Emergency Medicine | Admitting: Emergency Medicine

## 2015-07-18 DIAGNOSIS — R109 Unspecified abdominal pain: Secondary | ICD-10-CM | POA: Diagnosis not present

## 2015-07-18 DIAGNOSIS — Z3202 Encounter for pregnancy test, result negative: Secondary | ICD-10-CM | POA: Diagnosis not present

## 2015-07-18 DIAGNOSIS — Z87891 Personal history of nicotine dependence: Secondary | ICD-10-CM | POA: Insufficient documentation

## 2015-07-18 DIAGNOSIS — Z79899 Other long term (current) drug therapy: Secondary | ICD-10-CM | POA: Diagnosis not present

## 2015-07-18 DIAGNOSIS — Z9851 Tubal ligation status: Secondary | ICD-10-CM | POA: Insufficient documentation

## 2015-07-18 DIAGNOSIS — R110 Nausea: Secondary | ICD-10-CM | POA: Diagnosis not present

## 2015-07-18 DIAGNOSIS — E039 Hypothyroidism, unspecified: Secondary | ICD-10-CM | POA: Insufficient documentation

## 2015-07-18 LAB — URINALYSIS, ROUTINE W REFLEX MICROSCOPIC
Bilirubin Urine: NEGATIVE
Glucose, UA: NEGATIVE mg/dL
Hgb urine dipstick: NEGATIVE
Ketones, ur: NEGATIVE mg/dL
LEUKOCYTES UA: NEGATIVE
NITRITE: NEGATIVE
PROTEIN: NEGATIVE mg/dL
Specific Gravity, Urine: 1.025 (ref 1.005–1.030)
Urobilinogen, UA: 1 mg/dL (ref 0.0–1.0)
pH: 6.5 (ref 5.0–8.0)

## 2015-07-18 LAB — POC URINE PREG, ED: Preg Test, Ur: NEGATIVE

## 2015-07-18 NOTE — Discharge Instructions (Signed)
-   Use tylenol or advil for pain relief.  - Follow up with PCP if symptoms persist   Flank Pain Flank pain refers to pain that is located on the side of the body between the upper abdomen and the back. The pain may occur over a short period of time (acute) or may be long-term or reoccurring (chronic). It may be mild or severe. Flank pain can be caused by many things. CAUSES  Some of the more common causes of flank pain include:  Muscle strains.   Muscle spasms.   A disease of your spine (vertebral disk disease).   A lung infection (pneumonia).   Fluid around your lungs (pulmonary edema).   A kidney infection.   Kidney stones.   A very painful skin rash caused by the chickenpox virus (shingles).   Gallbladder disease.  HOME CARE INSTRUCTIONS  Home care will depend on the cause of your pain. In general,  Rest as directed by your caregiver.  Drink enough fluids to keep your urine clear or pale yellow.  Only take over-the-counter or prescription medicines as directed by your caregiver. Some medicines may help relieve the pain.  Tell your caregiver about any changes in your pain.  Follow up with your caregiver as directed. SEEK IMMEDIATE MEDICAL CARE IF:   Your pain is not controlled with medicine.   You have new or worsening symptoms.  Your pain increases.   You have abdominal pain.   You have shortness of breath.   You have persistent nausea or vomiting.   You have swelling in your abdomen.   You feel faint or pass out.   You have blood in your urine.  You have a fever or persistent symptoms for more than 2-3 days.  You have a fever and your symptoms suddenly get worse. MAKE SURE YOU:   Understand these instructions.  Will watch your condition.  Will get help right away if you are not doing well or get worse.   This information is not intended to replace advice given to you by your health care provider. Make sure you discuss any  questions you have with your health care provider.   Document Released: 10/22/2005 Document Revised: 05/25/2012 Document Reviewed: 04/14/2012 Elsevier Interactive Patient Education [employer]2016 [employer]

## 2015-07-18 NOTE — ED Provider Notes (Signed)
CSN: [id number]     Arrival date & time 07/18/15  1930 History   First MD Initiated Contact with Patient 07/18/15 2122     Chief Complaint  Patient presents with  . Flank Pain    left  . Possible Pregnancy    31 days late, states her tubes are tied   HPI  Ms. Susan Schmitt is a 39 year old female with a past medical history of hypothyroidism presenting with left flank pain. She reports she has had left flank pain intermittently for the past 2 weeks. The pain is in her left lateral abdomen and occasionally radiates into her left lower quadrant. The pain is mild and throbbing. She states that the pain was previously intermittent but is now constant over the past day. She states that movement sometimes exacerbates the pain. She denies any car accidents, traumas, falls or other possible mechanical causes of the pain. She has not tried over-the-counter pain relievers, heat or ice on the pain. She states "I just feel like the pain is sitting there". She states that she has occasionally felt nauseous over the past 2-3 weeks. She is not currently nauseous. Denies associated fevers, chills, myalgias, abdominal pain, vomiting, diarrhea, vaginal discharge, dysuria, hematuria, or pelvic pain. She denies a history of kidney stones. She does state that she was recently treated for BV. Patient reports that she saw her PCP to be evaluated for this flank pain. She states that they did a urine test but have not called her with the results. She reports no diagnosis received at this visit. She is also concerned that she may be pregnant because her period is 31 days late. She reports this is unusual for her. She has had a tubal ligation and believes that this is unlikely but just wants to check.  Past Medical History  Diagnosis Date  . Hypothyroid   . Radiation     thyroid   Past Surgical History  Procedure Laterality Date  . Thumb arthroscopy    . Tubal ligation     History reviewed. No pertinent family  history. Social History  Substance Use Topics  . Smoking status: Former Games developermoker  . Smokeless tobacco: None  . Alcohol Use: No   OB History    No data available     Review of Systems  Constitutional: Negative for fever, chills and diaphoresis.  Respiratory: Negative for shortness of breath.   Cardiovascular: Negative for chest pain.  Gastrointestinal: Positive for nausea. Negative for vomiting and abdominal pain.  Genitourinary: Positive for flank pain and menstrual problem. Negative for dysuria, hematuria, decreased urine volume, vaginal discharge and difficulty urinating.  Musculoskeletal: Negative for myalgias and arthralgias.  Skin: Negative for rash.  Neurological: Negative for headaches.  All other systems reviewed and are negative.     Allergies  Review of patient's allergies indicates no known allergies.  Home Medications   Prior to Admission medications   Medication Sig Start Date End Date Taking? Authorizing Provider  levothyroxine (SYNTHROID, LEVOTHROID) 100 MCG tablet Take 100 mcg by mouth daily.     Yes Historical Provider, MD  Multiple Vitamin (MULTIVITAMIN WITH MINERALS) TABS tablet Take 1 tablet by mouth daily.   Yes Historical Provider, MD  doxycycline (VIBRAMYCIN) 100 MG capsule Take 1 capsule (100 mg total) by mouth 2 (two) times daily. Patient not taking: Reported on 07/18/2015 04/19/15   Arthor CaptainAbigail Harris, PA-C   BP 115/74 mmHg  Pulse 64  Temp(Src) 98.2 F (36.8 C) (Oral)  Resp 20  SpO2 100%  LMP 05/21/2015 (Exact Date) Physical Exam  Constitutional: She appears well-developed and well-nourished. No distress.  HENT:  Head: Normocephalic and atraumatic.  Eyes: Conjunctivae are normal. Right eye exhibits no discharge. Left eye exhibits no discharge. No scleral icterus.  Neck: Normal range of motion.  Cardiovascular: Normal rate, regular rhythm and normal heart sounds.   Pulmonary/Chest: Effort normal and breath sounds normal. No respiratory distress. She  has no wheezes. She has no rales.  Abdominal: Soft. She exhibits no distension. There is no tenderness. There is no rebound and no guarding.  Abdomen soft nontender. Patient points to left lateral abdominal wall as source of pain. Palpation does not reproduce the pain. No guarding or rigidity or rebound. No CVA tenderness bilaterally.  Musculoskeletal: Normal range of motion.  Moves all extremities spontaneously and walks with a steady gait.  Neurological: She is alert. Coordination normal.  Skin: Skin is warm and dry. No rash noted. She is not diaphoretic.  No skin changes over flank  Psychiatric: She has a normal mood and affect. Her behavior is normal.  Nursing note and vitals reviewed.   ED Course  Procedures (including critical care time) Labs Review Labs Reviewed  URINALYSIS, ROUTINE W REFLEX MICROSCOPIC (NOT AT [facility]) - Abnormal; Notable for the following:    APPearance CLOUDY (*)    All other components within normal limits  POC URINE PREG, ED    Imaging Review No results found. I have personally reviewed and evaluated these images and lab results as part of my medical decision-making.   EKG Interpretation None      MDM   Final diagnoses:  Flank pain    Patient presenting with 2-3 week history of left flank pain described as throbbing. She was also concerned that she may be pregnant due to a missed period. Pain is mild and occasionally exacerbated by movement. Denies history of kidney stones. Denies fevers, chills, dysuria, hematuria, increased frequency, abdominal pain, vomiting. Afebrile. Patient is nontoxic, nonseptic appearing. Abdomen is soft, nontender. No guarding or rigidity. Patient is able to point out the pain on her left lateral abdominal wall but it is not reproducible with palpation. Urinalysis without blood. Point-of-care pregnancy negative. Unlikely to be kidney stone. No indications for imaging at this time. At this time there does not appear to be any  evidence of an acute emergency medical condition and the patient appears stable for discharge with appropriate outpatient follow up. Diagnosis was discussed with patient who verbalizes understanding and is agreeable to discharge. Pt case discussed with Dr. Deretha Emory who agrees with my plan. Return precautions given in discharge paperwork and discussed with pt at bedside. Pt is stable for discharge.     Rolm Gala Lynix Bonine, PA-C 07/19/15 0007  Vanetta Mulders, MD 07/21/15 [phone]

## 2015-07-18 NOTE — ED Notes (Addendum)
Patient states she is having left flank pain, also states she is 31 days late for her menstrual cycle. Patient states her tubes are tied. Patient states she feels nauseated. Patient states she was seen by PCP 2-3 weeks ago for the flank pain, states the MD took a urine sample but never called her with results.

## 2015-07-23 ENCOUNTER — Other Ambulatory Visit (HOSPITAL_COMMUNITY): Payer: Self-pay | Admitting: Family Medicine

## 2015-07-23 DIAGNOSIS — N760 Acute vaginitis: Secondary | ICD-10-CM

## 2015-07-23 DIAGNOSIS — B9689 Other specified bacterial agents as the cause of diseases classified elsewhere: Secondary | ICD-10-CM

## 2015-07-23 DIAGNOSIS — E890 Postprocedural hypothyroidism: Secondary | ICD-10-CM

## 2015-07-23 DIAGNOSIS — E0590 Thyrotoxicosis, unspecified without thyrotoxic crisis or storm: Secondary | ICD-10-CM

## 2015-07-23 DIAGNOSIS — L509 Urticaria, unspecified: Secondary | ICD-10-CM

## 2016-01-30 DIAGNOSIS — E039 Hypothyroidism, unspecified: Secondary | ICD-10-CM | POA: Insufficient documentation

## 2016-01-30 DIAGNOSIS — Z9889 Other specified postprocedural states: Secondary | ICD-10-CM | POA: Insufficient documentation

## 2016-02-27 DIAGNOSIS — A6000 Herpesviral infection of urogenital system, unspecified: Secondary | ICD-10-CM | POA: Insufficient documentation

## 2016-06-17 ENCOUNTER — Encounter: Payer: Self-pay | Admitting: Emergency Medicine

## 2016-06-17 ENCOUNTER — Emergency Department
Admission: EM | Admit: 2016-06-17 | Discharge: 2016-06-17 | Disposition: A | Discharge status: Home / Self Care | Payer: BLUE CROSS/BLUE SHIELD | Attending: Emergency Medicine | Admitting: Emergency Medicine

## 2016-06-17 DIAGNOSIS — Z87891 Personal history of nicotine dependence: Secondary | ICD-10-CM | POA: Diagnosis not present

## 2016-06-17 DIAGNOSIS — E039 Hypothyroidism, unspecified: Secondary | ICD-10-CM | POA: Insufficient documentation

## 2016-06-17 DIAGNOSIS — J029 Acute pharyngitis, unspecified: Secondary | ICD-10-CM | POA: Insufficient documentation

## 2016-06-17 LAB — POCT RAPID STREP A: STREPTOCOCCUS, GROUP A SCREEN (DIRECT): NEGATIVE

## 2016-06-17 MED ORDER — PREDNISONE 10 MG (21) PO TBPK: ORAL_TABLET | ORAL | 0 refills | Status: DC

## 2016-06-17 MED ORDER — ACETAMINOPHEN-CODEINE #3 300-30 MG PO TABS: 1.0000 | ORAL_TABLET | ORAL | 0 refills | Status: DC | PRN

## 2016-06-17 NOTE — ED Triage Notes (Signed)
Pt ambulatory to triage with steady gait, no distress noted. Pt c/o sore throat and left side ear pain x2 days.

## 2016-06-17 NOTE — ED Provider Notes (Signed)
[facility] Emergency Department Provider Note ____________________________________________  Time seen: Approximately 21:05 PM  I have reviewed the triage vital signs and the nursing notes.   HISTORY  Chief Complaint Sore Throat   HPI Susan Schmitt is a 40 y.o. female who presents to the emergency department for evaluation of sore throat that started 2 days ago.She is also complaining of left ear pain. No relief with over-the-counter Tylenol or ibuprofen. She has also had generalized body aches, but denies fever. She does state that she's also had a headache. Pain increases with swallowing and talking. She denies dysphasia.  Past Medical History:  Diagnosis Date  . Hypothyroid   . Radiation    thyroid    There are no active problems to display for this patient.   Past Surgical History:  Procedure Laterality Date  . THUMB ARTHROSCOPY    . TUBAL LIGATION      Prior to Admission medications   Medication Sig Start Date End Date Taking? Authorizing Provider  acetaminophen-codeine (TYLENOL #3) 300-30 MG tablet Take 1-2 tablets by mouth every 4 (four) hours as needed for moderate pain. 06/17/16   Chinita Pesterari B Miri Jose, FNP  doxycycline (VIBRAMYCIN) 100 MG capsule Take 1 capsule (100 mg total) by mouth 2 (two) times daily. Patient not taking: Reported on 07/18/2015 04/19/15   Arthor CaptainAbigail Harris, PA-C  levothyroxine (SYNTHROID, LEVOTHROID) 100 MCG tablet Take 100 mcg by mouth daily.      Historical Provider, MD  Multiple Vitamin (MULTIVITAMIN WITH MINERALS) TABS tablet Take 1 tablet by mouth daily.    Historical Provider, MD  predniSONE (STERAPRED UNI-PAK 21 TAB) 10 MG (21) TBPK tablet Take 6 tablets on day 1 Take 5 tablets on day 2 Take 4 tablets on day 3 Take 3 tablets on day 4 Take 2 tablets on day 5 Take 1 tablet on day 6 06/17/16   Chinita Pesterari B Torion Hulgan, FNP    Allergies Review of patient's allergies indicates no known allergies.  History reviewed. No pertinent  family history.  Social History Social History  Substance Use Topics  . Smoking status: Former Games developermoker  . Smokeless tobacco: Never Used  . Alcohol use No    Review of Systems Constitutional: No recent illness. HEENT: Positive for sore throat and left earache Cardiovascular: Denies chest pain or palpitations. Respiratory: Denies shortness of breath. Musculoskeletal: Denies  Skin: Negative for rash, wound, lesion. Neurological: Negative for focal weakness or numbness.  ____________________________________________   PHYSICAL EXAM:  VITAL SIGNS: ED Triage Vitals [06/17/16 1953]  Enc Vitals Group     BP 127/78     Pulse Rate 69     Resp      Temp 98.2 F (36.8 C)     Temp Source Oral     SpO2 100 %     Weight 173 lb (78.5 kg)     Height 5\' 5"  (1.651 m)     Head Circumference      Peak Flow      Pain Score 8     Pain Loc      Pain Edu?      Excl. in GC?     Constitutional: Alert and oriented. Well appearing and in no acute distress. Eyes: Conjunctivae are normal. EOMI. Head: Atraumatic. Ears: Left tympanic membrane mildly erythematous with serous fluid. Right tympanic membrane normal. Throat: Posterior pharynx mildly erythematous. Tonsils 1+ without exudate. Uvula is midline and without inflammation or edema. Airway appears patent. Neck: No stridor.  Respiratory: Normal respiratory  effort.  Breath sounds clear to auscultation throughout. Musculoskeletal: Full range of motion throughout. Neurologic:  Normal speech and language. No gross focal neurologic deficits are appreciated. Speech is normal. No gait instability. Skin:  Skin is warm, dry and intact. Atraumatic. Psychiatric: Mood and affect are normal. Speech and behavior are normal.  ____________________________________________   LABS (all labs ordered are listed, but only abnormal results are displayed)  Labs Reviewed  CULTURE, GROUP A STREP [facility])  POCT RAPID STREP A    ____________________________________________  RADIOLOGY  Not indicated ____________________________________________   PROCEDURES  Procedure(s) performed: None   ____________________________________________   INITIAL IMPRESSION / ASSESSMENT AND PLAN / ED COURSE  Clinical Course    Pertinent labs & imaging results that were available during my care of the patient were reviewed by me and considered in my medical decision making (see chart for details).  Patient appears to be uncomfortable, and complains of sore throat worse on the left, but no increase in size of the tonsil compared to the right, nor exudate. Patient was instructed to take the prednisone as well as the Tylenol 3 as prescribed. She was instructed to follow up with her PCP if not improving over the next 2 days. She was advised to return to the ER for symptoms that change or worsen if unable to schedule an appointment. ____________________________________________   FINAL CLINICAL IMPRESSION(S) / ED DIAGNOSES  Final diagnoses:  Viral pharyngitis       Chinita Pester, FNP 06/17/16 2357    Sharman Cheek, MD 06/19/16 1712

## 2016-06-17 NOTE — Discharge Instructions (Signed)
Follow-up with your primary care provider for symptoms that are not improving over the next 2-3 days. Return to the emergency department immediately for any difficulty breathing or swallowing.

## 2016-06-20 LAB — CULTURE, GROUP A STREP (THRC)

## 2016-11-09 ENCOUNTER — Emergency Department: Payer: BLUE CROSS/BLUE SHIELD

## 2016-11-09 ENCOUNTER — Emergency Department
Admission: EM | Admit: 2016-11-09 | Discharge: 2016-11-09 | Disposition: A | Discharge status: Home / Self Care | Payer: BLUE CROSS/BLUE SHIELD | Attending: Emergency Medicine | Admitting: Emergency Medicine

## 2016-11-09 ENCOUNTER — Encounter: Payer: Self-pay | Admitting: Emergency Medicine

## 2016-11-09 DIAGNOSIS — M79674 Pain in right toe(s): Secondary | ICD-10-CM | POA: Insufficient documentation

## 2016-11-09 DIAGNOSIS — E039 Hypothyroidism, unspecified: Secondary | ICD-10-CM | POA: Insufficient documentation

## 2016-11-09 DIAGNOSIS — Z87891 Personal history of nicotine dependence: Secondary | ICD-10-CM | POA: Insufficient documentation

## 2016-11-09 NOTE — ED Provider Notes (Signed)
[facility] Emergency Department Provider Note  ____________________________________________  Time seen: Approximately 8:57 PM  I have reviewed the triage vital signs and the nursing notes.   HISTORY  Chief Complaint Toe Pain    HPI Susan L Catalina [place] is a 41 y.o. female presents to the emergency department with right great toe pain for 4 months. Pain is located on the top of toe and "feels like it's in the bone." Patient states that pain is worse with walking and exercising. Patient does not recall injuring toe. Patient has been taking ibuprofen and Tylenol for pain. She denies fever, shortness breath, chest pain, nausea, vomiting, abdominal pain, numbness, tingling.   Past Medical History:  Diagnosis Date  . Hypothyroid   . Radiation    thyroid    There are no active problems to display for this patient.   Past Surgical History:  Procedure Laterality Date  . THUMB ARTHROSCOPY    . TUBAL LIGATION      Prior to Admission medications   Medication Sig Start Date End Date Taking? Authorizing Provider  acetaminophen-codeine (TYLENOL #3) 300-30 MG tablet Take 1-2 tablets by mouth every 4 (four) hours as needed for moderate pain. 06/17/16   Chinita Pesterari B Triplett, FNP  levothyroxine (SYNTHROID, LEVOTHROID) 100 MCG tablet Take 100 mcg by mouth daily.      Historical Provider, MD  Multiple Vitamin (MULTIVITAMIN WITH MINERALS) TABS tablet Take 1 tablet by mouth daily.    Historical Provider, MD  predniSONE (STERAPRED UNI-PAK 21 TAB) 10 MG (21) TBPK tablet Take 6 tablets on day 1 Take 5 tablets on day 2 Take 4 tablets on day 3 Take 3 tablets on day 4 Take 2 tablets on day 5 Take 1 tablet on day 6 06/17/16   Chinita Pesterari B Triplett, FNP    Allergies Patient has no known allergies.  No family history on file.  Social History Social History  Substance Use Topics  . Smoking status: Former Games developermoker  . Smokeless tobacco: Never Used  . Alcohol use No     Review of  Systems  Constitutional: No fever/chills ENT: No upper respiratory complaints. Cardiovascular: No chest pain. Respiratory: No cough. No SOB. Gastrointestinal: No abdominal pain.  No nausea, no vomiting.  Skin: Negative for rash, abrasions, lacerations, ecchymosis. Neurological: Negative for headaches, numbness or tingling   ____________________________________________   PHYSICAL EXAM:  VITAL SIGNS: ED Triage Vitals [11/09/16 2034]  Enc Vitals Group     BP 127/75     Pulse Rate 93     Resp 16     Temp 99.5 F (37.5 C)     Temp Source Oral     SpO2 97 %     Weight 164 lb (74.4 kg)     Height 5\' 5"  (1.651 m)     Head Circumference      Peak Flow      Pain Score 7     Pain Loc      Pain Edu?      Excl. in GC?      Constitutional: Alert and oriented. Well appearing and in no acute distress. Eyes: Conjunctivae are normal. PERRL. EOMI. Head: Atraumatic. ENT:      Ears:      Nose: No congestion/rhinnorhea.      Mouth/Throat: Mucous membranes are moist.  Neck: No stridor.   Cardiovascular: Normal rate, regular rhythm.  Good peripheral circulation. Respiratory: Normal respiratory effort without tachypnea or retractions. Lungs CTAB. Good air entry to the bases  with no decreased or absent breath sounds. Musculoskeletal: Full range of motion to all extremities. No gross deformities appreciated. Tenderness to palpation at the base of great right toe. No tenderness to palpation over the plantar side of foot. Neurologic:  Normal speech and language. No gross focal neurologic deficits are appreciated.  Skin:  Skin is warm, dry and intact. No rash noted. Psychiatric: Mood and affect are normal. Speech and behavior are normal. Patient exhibits appropriate insight and judgement.   ____________________________________________   LABS (all labs ordered are listed, but only abnormal results are displayed)  Labs Reviewed - No data to  display ____________________________________________  EKG   ____________________________________________  RADIOLOGY Lexine Baton, personally viewed and evaluated these images (plain radiographs) as part of my medical decision making, as well as reviewing the written report by the radiologist.  Dg Toe Great Right  Result Date: 11/09/2016 CLINICAL DATA:  Right great toe pain for 4 months. EXAM: RIGHT GREAT TOE COMPARISON:  None. FINDINGS: There is no evidence of acute fracture or dislocation. Accessory ossicle versus sequela of remote prior injury about the medial aspect of the distal phalanx at the articular medial surface. There is no evidence of arthropathy or other focal bone abnormality. Soft tissues are unremarkable. IMPRESSION: Accessory ossicle versus sequela of remote prior injury about the great toe distal phalanx. No acute bony abnormality. Electronically Signed   By: Rubye Oaks M.D.   On: 11/09/2016 21:20    ____________________________________________    PROCEDURES  Procedure(s) performed:    Procedures    Medications - No data to display   ____________________________________________   INITIAL IMPRESSION / ASSESSMENT AND PLAN / ED COURSE  Pertinent labs & imaging results that were available during my care of the patient were reviewed by me and considered in my medical decision making (see chart for details).  Review of the [place] CSRS was performed in accordance of the [facility] prior to dispensing any controlled drugs.   Patient presented to the emergency department with right toe pain for 4 months. It was indicated on x-ray that patient may have accessory ossicle or previous toe injury. Vital signs and exam are reassuring. Patient is to follow up with podiatry as directed. Patient is given ED precautions to return to the ED for any worsening or new symptoms.     ____________________________________________  FINAL CLINICAL IMPRESSION(S) / ED  DIAGNOSES  Final diagnoses:  Great toe pain, right      NEW MEDICATIONS STARTED DURING THIS VISIT:  Discharge Medication List as of 11/09/2016 10:03 PM          This chart was dictated using voice recognition software/Dragon. Despite best efforts to proofread, errors can occur which can change the meaning. Any change was purely unintentional.    Enid Derry, PA-C 11/10/16 0013    Merrily Brittle, MD 11/12/16 1027

## 2016-11-09 NOTE — ED Triage Notes (Signed)
C/o right great toe pain x 4 months.  Denies injury.

## 2016-11-09 NOTE — ED Notes (Signed)
See triage note, pt states for the last 4 months she has had right great toe pain. Pt denies inj to toe and states "it's hard to walk on it." Pt denies swelling or redness to area.

## 2017-09-14 ENCOUNTER — Ambulatory Visit
Admission: EM | Admit: 2017-09-14 | Discharge: 2017-09-14 | Disposition: A | Discharge status: Home / Self Care | Payer: BLUE CROSS/BLUE SHIELD | Attending: Family Medicine | Admitting: Family Medicine

## 2017-09-14 ENCOUNTER — Other Ambulatory Visit: Payer: Self-pay

## 2017-09-14 DIAGNOSIS — M7052 Other bursitis of knee, left knee: Secondary | ICD-10-CM | POA: Diagnosis not present

## 2017-09-14 DIAGNOSIS — M25562 Pain in left knee: Secondary | ICD-10-CM | POA: Diagnosis not present

## 2017-09-14 MED ORDER — NAPROXEN 500 MG PO TABS: 500.0000 mg | ORAL_TABLET | Freq: Two times a day (BID) | ORAL | 0 refills | Status: DC

## 2017-09-14 NOTE — Discharge Instructions (Signed)
Keep Ace wrap on. Avoid weight bearing and prolong standing. If no improvement or Sx worsens will see Ortho for further management

## 2017-09-14 NOTE — ED Triage Notes (Signed)
Pt reports she awoke about a week ago with left knee pain. No known injury. Difficult to straighten and limping gait. Mild swelling. Pain 8/10

## 2017-09-14 NOTE — ED Provider Notes (Signed)
[facility]    CSN: [id number] Arrival date & time: 09/14/17  1633     History   Chief Complaint Chief Complaint  Patient presents with  . Knee Pain    HPI Susan Schmitt is a 42 y.o. female presented to clinic with CC of left knee pain x 1 week. Pain worse with walking and weight bearing.Denies injury/trauma/fall.  The history is provided by the patient.  Knee Pain  Location:  Knee Knee location:  L knee Pain details:    Quality:  Aching   Radiates to:  Does not radiate   Severity:  Moderate   Duration:  7 days   Timing:  Constant   Progression:  Worsening Chronicity:  New Worsened by:  Bearing weight   Past Medical History:  Diagnosis Date  . Hypothyroid   . Radiation    thyroid    There are no active problems to display for this patient.   Past Surgical History:  Procedure Laterality Date  . THUMB ARTHROSCOPY    . TUBAL LIGATION      OB History    No data available       Home Medications    Prior to Admission medications   Medication Sig Start Date End Date Taking? Authorizing Provider  acetaminophen-codeine (TYLENOL #3) 300-30 MG tablet Take 1-2 tablets by mouth every 4 (four) hours as needed for moderate pain. 06/17/16   Triplett, Cari B, FNP  levothyroxine (SYNTHROID, LEVOTHROID) 100 MCG tablet Take 100 mcg by mouth daily.      [provider]  Multiple Vitamin (MULTIVITAMIN WITH MINERALS) TABS tablet Take 1 tablet by mouth daily.    [provider]  naproxen (NAPROSYN) 500 MG tablet Take 1 tablet (500 mg total) by mouth 2 (two) times daily. 09/14/17   Letesha Klecker, NP  predniSONE (STERAPRED UNI-PAK 21 TAB) 10 MG (21) TBPK tablet Take 6 tablets on day 1 Take 5 tablets on day 2 Take 4 tablets on day 3 Take 3 tablets on day 4 Take 2 tablets on day 5 Take 1 tablet on day 6 06/17/16   Chinita Pesterriplett, Cari B, FNP    Family History Family History  Problem Relation Age of Onset  . COPD Mother   . Depression Father       Social History Social History   Tobacco Use  . Smoking status: Former Smoker    Types: Cigarettes  . Smokeless tobacco: Never Used  Substance Use Topics  . Alcohol use: Yes    Comment: social  . Drug use: No     Allergies   Patient has no known allergies.   Review of Systems Review of Systems  Constitutional: Negative.   HENT: Negative.   Respiratory: Negative.   Cardiovascular: Negative.   Musculoskeletal: Positive for joint swelling (Left knee pain, swelling.).     Physical Exam Triage Vital Signs ED Triage Vitals  Enc Vitals Group     BP 09/14/17 1646 114/78     Pulse Rate 09/14/17 1646 68     Resp 09/14/17 1646 16     Temp 09/14/17 1646 98.2 F (36.8 C)     Temp src --      SpO2 09/14/17 1646 100 %     Weight 09/14/17 1642 169 lb (76.7 kg)     Height 09/14/17 1642 5\' 5"  (1.651 m)     Head Circumference --      Peak Flow --      Pain Score 09/14/17  1642 8     Pain Loc --      Pain Edu? --      Excl. in GC? --    No data found.  Updated Vital Signs BP 114/78 (BP Location: Right Arm)   Pulse 68   Temp 98.2 F (36.8 C)   Resp 16   Ht 5\' 5"  (1.651 m)   Wt 169 lb (76.7 kg)   LMP 08/31/2017   SpO2 100%   BMI 28.12 kg/m   Visual Acuity Right Eye Distance:   Left Eye Distance:   Bilateral Distance:    Right Eye Near:   Left Eye Near:    Bilateral Near:     Physical Exam  Constitutional: She is oriented to person, place, and time. She appears well-developed and well-nourished.  HENT:  Head: Normocephalic.  Eyes: Pupils are equal, round, and reactive to light.  Cardiovascular: Normal rate, regular rhythm and normal heart sounds.  Musculoskeletal: Normal range of motion. She exhibits edema (Mild swellling to lateral aspect of left knee. ) and tenderness (Tenderness to anterior aspect. No crepitus. Normal ROM with flexion., extension. Negative Valgus and Varus).  Neurological: She is alert and oriented to person, place, and time.  Skin:  Skin is warm.     UC Treatments / Results  Labs (all labs ordered are listed, but only abnormal results are displayed) Labs Reviewed - No data to display  EKG  EKG Interpretation None       Radiology No results found.  Procedures Procedures (including critical care time)  Medications Ordered in UC Medications - No data to display   Initial Impression / Assessment and Plan / UC Course  I have reviewed the triage vital signs and the nursing notes.  Pertinent labs & imaging results that were available during my care of the patient were reviewed by me and considered in my medical decision making (see chart for details).       Final Clinical Impressions(s) / UC Diagnoses   Final diagnoses:  Arthralgia of left knee  Bursitis of other bursa of left knee    ED Discharge Orders        Ordered    naproxen (NAPROSYN) 500 MG tablet  2 times daily     09/14/17 1738       Controlled Substance Prescriptions [place] Controlled Substance Registry consulted? Not Applicable   Reinaldo Raddle, NP 09/14/17 1739

## 2017-09-16 ENCOUNTER — Ambulatory Visit
Admission: EM | Admit: 2017-09-16 | Discharge: 2017-09-16 | Disposition: A | Discharge status: Home / Self Care | Payer: BLUE CROSS/BLUE SHIELD | Attending: Internal Medicine | Admitting: Internal Medicine

## 2017-09-16 ENCOUNTER — Encounter: Payer: Self-pay | Admitting: Gynecology

## 2017-09-16 ENCOUNTER — Other Ambulatory Visit: Payer: Self-pay

## 2017-09-16 DIAGNOSIS — S83207A Unspecified tear of unspecified meniscus, current injury, left knee, initial encounter: Secondary | ICD-10-CM

## 2017-09-16 MED ORDER — MELOXICAM 7.5 MG PO TABS: 7.5000 mg | ORAL_TABLET | Freq: Two times a day (BID) | ORAL | 1 refills | Status: AC | PRN

## 2017-09-16 NOTE — ED Provider Notes (Signed)
[facility]    CSN: [id number] Arrival date & time: 09/16/17  1417     History   Chief Complaint Chief Complaint  Patient presents with  . Joint Swelling    HPI Tehani L Tranchina is a 42 y.o. female.   Pt c/o swelling of left knee. It was not swollen when she was seen here 2 days ago. No laxity at that time. Denies fevers. Cannot remember injuring knee.       Past Medical History:  Diagnosis Date  . Hypothyroid   . Radiation    thyroid    There are no active problems to display for this patient.   Past Surgical History:  Procedure Laterality Date  . THUMB ARTHROSCOPY    . TUBAL LIGATION      OB History    No data available       Home Medications    Prior to Admission medications   Medication Sig Start Date End Date Taking? Authorizing Provider  acetaminophen-codeine (TYLENOL #3) 300-30 MG tablet Take 1-2 tablets by mouth every 4 (four) hours as needed for moderate pain. 06/17/16  Yes Triplett, Cari B, FNP  levothyroxine (SYNTHROID, LEVOTHROID) 100 MCG tablet Take 100 mcg by mouth daily.     Yes [provider]  Multiple Vitamin (MULTIVITAMIN WITH MINERALS) TABS tablet Take 1 tablet by mouth daily.   Yes [provider]  meloxicam (MOBIC) 7.5 MG tablet Take 1 tablet (7.5 mg total) by mouth 2 (two) times daily as needed for up to 14 days for pain (knee pain/swelling). Do NOT take on an empty stomach. Drink plenty of water 09/16/17 09/30/17  Arnaldo Natal, MD  predniSONE (STERAPRED UNI-PAK 21 TAB) 10 MG (21) TBPK tablet Take 6 tablets on day 1 Take 5 tablets on day 2 Take 4 tablets on day 3 Take 3 tablets on day 4 Take 2 tablets on day 5 Take 1 tablet on day 6 06/17/16   Chinita Pester, FNP    Family History Family History  Problem Relation Age of Onset  . COPD Mother   . Depression Father     Social History Social History   Tobacco Use  . Smoking status: Former Smoker    Types: Cigarettes  . Smokeless tobacco:  Never Used  Substance Use Topics  . Alcohol use: Yes    Comment: social  . Drug use: No     Allergies   Patient has no known allergies.   Review of Systems Review of Systems  Constitutional: Negative for chills and fever.  HENT: Negative for sore throat and tinnitus.   Eyes: Negative for redness.  Respiratory: Negative for cough and shortness of breath.   Cardiovascular: Negative for chest pain and palpitations.  Gastrointestinal: Negative for abdominal pain, diarrhea, nausea and vomiting.  Genitourinary: Negative for dysuria, frequency and urgency.  Musculoskeletal: Positive for joint swelling. Negative for myalgias.  Skin: Negative for rash.       No lesions  Neurological: Negative for weakness.  Hematological: Does not bruise/bleed easily.  Psychiatric/Behavioral: Negative for suicidal ideas.     Physical Exam Triage Vital Signs ED Triage Vitals  Enc Vitals Group     BP 09/16/17 1434 115/75     Pulse Rate 09/16/17 1434 72     Resp 09/16/17 1434 16     Temp 09/16/17 1434 98.2 F (36.8 C)     Temp Source 09/16/17 1434 Oral     SpO2 09/16/17 1434 100 %  Weight 09/16/17 1432 169 lb (76.7 kg)     Height --      Head Circumference --      Peak Flow --      Pain Score 09/16/17 1432 3     Pain Loc --      Pain Edu? --      Excl. in GC? --    No data found.  Updated Vital Signs BP 115/75 (BP Location: Right Arm)   Pulse 72   Temp 98.2 F (36.8 C) (Oral)   Resp 16   Wt 169 lb (76.7 kg)   LMP 09/15/2017   SpO2 100%   BMI 28.12 kg/m   Visual Acuity Right Eye Distance:   Left Eye Distance:   Bilateral Distance:    Right Eye Near:   Left Eye Near:    Bilateral Near:     Physical Exam  Constitutional: She is oriented to person, place, and time. She appears well-developed and well-nourished. No distress.  HENT:  Head: Normocephalic and atraumatic.  Mouth/Throat: Oropharynx is clear and moist.  Eyes: Conjunctivae and EOM are normal. Pupils are equal,  round, and reactive to light. No scleral icterus.  Neck: Normal range of motion. Neck supple. No JVD present. No tracheal deviation present. No thyromegaly present.  Cardiovascular: Normal rate, regular rhythm and normal heart sounds. Exam reveals no gallop and no friction rub.  No murmur heard. Pulmonary/Chest: Effort normal and breath sounds normal.  Abdominal: Soft. Bowel sounds are normal. She exhibits no distension. There is no tenderness.  Musculoskeletal: Normal range of motion. She exhibits no edema.       Legs: Swelling and mild tenderness below knee joint. ROM limited when crossing leg.  Lymphadenopathy:    She has no cervical adenopathy.  Neurological: She is alert and oriented to person, place, and time. No cranial nerve deficit.  Skin: Skin is warm and dry.  Psychiatric: She has a normal mood and affect. Her behavior is normal. Judgment and thought content normal.  Nursing note and vitals reviewed.    UC Treatments / Results  Labs (all labs ordered are listed, but only abnormal results are displayed) Labs Reviewed - No data to display  EKG  EKG Interpretation None       Radiology No results found.  Procedures Procedures (including critical care time)  Medications Ordered in UC Medications - No data to display   Initial Impression / Assessment and Plan / UC Course  I have reviewed the triage vital signs and the nursing notes.  Pertinent labs & imaging results that were available during my care of the patient were reviewed by me and considered in my medical decision making (see chart for details).     Likely meniscal tear. Needs MRI and eval by ortho. NSAID therapy.  Final Clinical Impressions(s) / UC Diagnoses   Final diagnoses:  Tear of left meniscus as current injury, initial encounter    ED Discharge Orders        Ordered    meloxicam (MOBIC) 7.5 MG tablet  2 times daily PRN     09/16/17 1501       Controlled Substance Prescriptions [place]  Controlled Substance Registry consulted? Not Applicable   Arnaldo Nataliamond, Robbi Scurlock S, MD 09/16/17 1520

## 2017-09-16 NOTE — ED Triage Notes (Signed)
Per patient was seen x 2 days ago for left knee pain. Patient stated now with left leg swelling sx yesterday.

## 2017-09-17 DIAGNOSIS — M222X9 Patellofemoral disorders, unspecified knee: Secondary | ICD-10-CM | POA: Insufficient documentation

## 2017-10-15 DIAGNOSIS — M5126 Other intervertebral disc displacement, lumbar region: Secondary | ICD-10-CM | POA: Insufficient documentation

## 2017-10-15 DIAGNOSIS — M545 Low back pain, unspecified: Secondary | ICD-10-CM | POA: Insufficient documentation

## 2018-01-12 DIAGNOSIS — M533 Sacrococcygeal disorders, not elsewhere classified: Secondary | ICD-10-CM | POA: Insufficient documentation

## 2018-01-17 ENCOUNTER — Other Ambulatory Visit: Payer: Self-pay | Admitting: Physical Medicine and Rehabilitation

## 2018-01-17 DIAGNOSIS — M533 Sacrococcygeal disorders, not elsewhere classified: Secondary | ICD-10-CM

## 2018-01-25 ENCOUNTER — Ambulatory Visit
Admission: RE | Admit: 2018-01-25 | Discharge: 2018-01-25 | Disposition: A | Discharge status: Home / Self Care | Payer: Worker's Compensation | Source: Ambulatory Visit | Attending: Physical Medicine and Rehabilitation | Admitting: Physical Medicine and Rehabilitation

## 2018-01-25 DIAGNOSIS — M533 Sacrococcygeal disorders, not elsewhere classified: Secondary | ICD-10-CM

## 2018-11-01 ENCOUNTER — Other Ambulatory Visit (HOSPITAL_COMMUNITY)
Admission: RE | Admit: 2018-11-01 | Discharge: 2018-11-01 | Disposition: A | Discharge status: Home / Self Care | Payer: BLUE CROSS/BLUE SHIELD | Source: Ambulatory Visit | Attending: Family Medicine | Admitting: Family Medicine

## 2018-11-01 ENCOUNTER — Other Ambulatory Visit: Payer: Self-pay | Admitting: Family Medicine

## 2018-11-01 DIAGNOSIS — Z124 Encounter for screening for malignant neoplasm of cervix: Secondary | ICD-10-CM | POA: Insufficient documentation

## 2018-11-03 LAB — CYTOLOGY - PAP
Bacterial vaginitis: POSITIVE — AB
CANDIDA VAGINITIS: NEGATIVE
Chlamydia: NEGATIVE
Diagnosis: NEGATIVE
HPV: DETECTED — AB
Neisseria Gonorrhea: NEGATIVE
Trichomonas: NEGATIVE

## 2019-10-30 ENCOUNTER — Other Ambulatory Visit: Payer: Self-pay | Admitting: Podiatry

## 2019-10-30 ENCOUNTER — Other Ambulatory Visit: Payer: Self-pay

## 2019-10-30 ENCOUNTER — Encounter: Payer: Self-pay | Admitting: Podiatry

## 2019-10-30 ENCOUNTER — Ambulatory Visit (INDEPENDENT_AMBULATORY_CARE_PROVIDER_SITE_OTHER): Payer: Managed Care, Other (non HMO)

## 2019-10-30 ENCOUNTER — Ambulatory Visit: Payer: Managed Care, Other (non HMO) | Admitting: Podiatry

## 2019-10-30 VITALS — BP 110/79 | HR 71 | Temp 96.7°F

## 2019-10-30 DIAGNOSIS — M205X1 Other deformities of toe(s) (acquired), right foot: Secondary | ICD-10-CM | POA: Diagnosis not present

## 2019-10-30 DIAGNOSIS — M79671 Pain in right foot: Secondary | ICD-10-CM

## 2019-10-30 NOTE — Progress Notes (Signed)
Subjective:   Patient ID: Susan Schmitt, female   DOB: 44 y.o.   MRN: [id number]   HPI Patient presents with several year history of pain in the big toe joint right and stated she saw another physician several years ago who put medicine in but did not educate her.  States it is getting worse and she is having trouble wearing shoe gear or being able to be active with the pain.  Patient does not smoke likes to be active and is tried conservative treatments for this condition   Review of Systems  All other systems reviewed and are negative.       Objective:  Physical Exam Vitals and nursing note reviewed.  Constitutional:      Appearance: She is well-developed.  Pulmonary:     Effort: Pulmonary effort is normal.  Musculoskeletal:        General: Normal range of motion.  Skin:    General: Skin is warm.  Neurological:     Mental Status: She is alert.     Neurovascular status found to be intact muscle strength found to be adequate range of motion within normal limits.  Patient is noted to have inflammation pain around the first MPJ right with reduced range of motion of that joint surface with no crepitus of the joint noted.  Patient has quite a bit of discomfort when I palpate around the first MPJ right mild discomfort more laterally where it appears she is walking differently and indications that there is bone growth on the dorsal surface of the first metatarsal.  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  I discussed hallux limitus rigidus bone spur formation that she has and at this point I have recommended a correction due to her age and the length of time she has had this.  Discussed biplanar osteotomy patient wants this done and wants to read consent form today.  I  reviewed with her consent form going over all possible alternative treatments complications and the fact that at 1 point in future she may require fusion or joint implantation procedure.  Patient is scheduled for outpatient surgery encouraged to call with questions air fracture walker dispensed today and understands total recovery will take 6 months to 1 year  X-rays right indicate that there is narrowing of the joint surface right with dorsal spurring and elevated first metatarsal segment right

## 2019-10-30 NOTE — Patient Instructions (Addendum)
Pre-Operative Instructions  Congratulations, you have decided to take an important step towards improving your quality of life.  You can be assured that the doctors and staff at [facility] will be with you every step of the way.  Here are some important things you should know:  1. Plan to be at the surgery center/hospital at least 1 (one) hour prior to your scheduled time, unless otherwise directed by the surgical center/hospital staff.  You must have a responsible adult accompany you, remain during the surgery and drive you home.  Make sure you have directions to the surgical center/hospital to ensure you arrive on time. 2. If you are having surgery at [facility] or [facility], you will need a copy of your medical history and physical form from your family physician within one month prior to the date of surgery. We will give you a form for your primary physician to complete.  3. We make every effort to accommodate the date you request for surgery.  However, there are times where surgery dates or times have to be moved.  We will contact you as soon as possible if a change in schedule is required.   4. No aspirin/ibuprofen for one week before surgery.  If you are on aspirin, any non-steroidal anti-inflammatory medications (Mobic, Aleve, Ibuprofen) should not be taken seven (7) days prior to your surgery.  You make take Tylenol for pain prior to surgery.  5. Medications - If you are taking daily heart and blood pressure medications, seizure, reflux, allergy, asthma, anxiety, pain or diabetes medications, make sure you notify the surgery center/hospital before the day of surgery so they can tell you which medications you should take or avoid the day of surgery. 6. No food or drink after midnight the night before surgery unless directed otherwise by surgical center/hospital staff. 7. No alcoholic beverages 24-hours prior to surgery.  No smoking 24-hours prior or 24-hours after  surgery. 8. Wear loose pants or shorts. They should be loose enough to fit over bandages, boots, and casts. 9. Don't wear slip-on shoes. Sneakers are preferred. 10. Bring your boot with you to the surgery center/hospital.  Also bring crutches or a walker if your physician has prescribed it for you.  If you do not have this equipment, it will be provided for you after surgery. 11. If you have not been contacted by the surgery center/hospital by the day before your surgery, call to confirm the date and time of your surgery. 12. Leave-time from work may vary depending on the type of surgery you have.  Appropriate arrangements should be made prior to surgery with your employer. 13. Prescriptions will be provided immediately following surgery by your doctor.  Fill these as soon as possible after surgery and take the medication as directed. Pain medications will not be refilled on weekends and must be approved by the doctor. 14. Remove nail polish on the operative foot and avoid getting pedicures prior to surgery. 15. Wash the night before surgery.  The night before surgery wash the foot and leg well with water and the antibacterial soap provided. Be sure to pay special attention to beneath the toenails and in between the toes.  Wash for at least three (3) minutes. Rinse thoroughly with water and dry well with a towel.  Perform this wash unless told not to do so by your physician.  Enclosed: 1 Ice pack (please put in freezer the night before surgery)   1 Hibiclens skin cleaner     Pre-op instructions  If you have any questions regarding the instructions, please do not hesitate to call our office.  [place]: [address], [place], [place] [address] -- [phone]  [place]: [address] [place], [place] [address] -- [phone]  [place]: [address], [place], [place] [address] -- [phone]   Website: [email or web address] 

## 2019-11-14 ENCOUNTER — Telehealth: Payer: Self-pay | Admitting: Podiatry

## 2019-11-14 NOTE — Telephone Encounter (Signed)
DOS: 12/12/2019  SURGICAL PROCEDURE: [facility] [place] Bi-Planar [phone]).  [employer] Effective 09/15/2019 -  Deductible: $1,000 with $262.50 met and $737.50 remains. Out of Pocket: $3,000 with $322.50 met and $2,677.50 remains. CoInsurance: 100% / 0%.  Per Adonis Huguenin C no prior authorization is required. Call ref# [id number].

## 2019-12-07 ENCOUNTER — Emergency Department (HOSPITAL_BASED_OUTPATIENT_CLINIC_OR_DEPARTMENT_OTHER)
Admission: EM | Admit: 2019-12-07 | Discharge: 2019-12-07 | Disposition: A | Discharge status: Home / Self Care | Payer: Managed Care, Other (non HMO) | Attending: Emergency Medicine | Admitting: Emergency Medicine

## 2019-12-07 ENCOUNTER — Other Ambulatory Visit: Payer: Self-pay

## 2019-12-07 ENCOUNTER — Emergency Department (HOSPITAL_BASED_OUTPATIENT_CLINIC_OR_DEPARTMENT_OTHER): Payer: Managed Care, Other (non HMO)

## 2019-12-07 ENCOUNTER — Encounter (HOSPITAL_BASED_OUTPATIENT_CLINIC_OR_DEPARTMENT_OTHER): Payer: Self-pay

## 2019-12-07 DIAGNOSIS — Z79899 Other long term (current) drug therapy: Secondary | ICD-10-CM | POA: Diagnosis not present

## 2019-12-07 DIAGNOSIS — R079 Chest pain, unspecified: Secondary | ICD-10-CM

## 2019-12-07 DIAGNOSIS — Z87891 Personal history of nicotine dependence: Secondary | ICD-10-CM | POA: Diagnosis not present

## 2019-12-07 DIAGNOSIS — R0789 Other chest pain: Secondary | ICD-10-CM | POA: Diagnosis present

## 2019-12-07 DIAGNOSIS — E039 Hypothyroidism, unspecified: Secondary | ICD-10-CM | POA: Diagnosis not present

## 2019-12-07 LAB — CBC WITH DIFFERENTIAL/PLATELET
Abs Immature Granulocytes: 0.02 10*3/uL (ref 0.00–0.07)
Basophils Absolute: 0.1 10*3/uL (ref 0.0–0.1)
Basophils Relative: 1 %
Eosinophils Absolute: 0.3 10*3/uL (ref 0.0–0.5)
Eosinophils Relative: 3 %
HCT: 36.4 % (ref 36.0–46.0)
Hemoglobin: 12.3 g/dL (ref 12.0–15.0)
Immature Granulocytes: 0 %
Lymphocytes Relative: 40 %
Lymphs Abs: 3.5 10*3/uL (ref 0.7–4.0)
MCH: 33.1 pg (ref 26.0–34.0)
MCHC: 33.8 g/dL (ref 30.0–36.0)
MCV: 97.8 fL (ref 80.0–100.0)
Monocytes Absolute: 0.7 10*3/uL (ref 0.1–1.0)
Monocytes Relative: 8 %
Neutro Abs: 4.2 10*3/uL (ref 1.7–7.7)
Neutrophils Relative %: 48 %
Platelets: 186 10*3/uL (ref 150–400)
RBC: 3.72 MIL/uL — ABNORMAL LOW (ref 3.87–5.11)
RDW: 11.5 % (ref 11.5–15.5)
WBC: 8.7 10*3/uL (ref 4.0–10.5)
nRBC: 0 % (ref 0.0–0.2)

## 2019-12-07 LAB — BASIC METABOLIC PANEL
Anion gap: 11 (ref 5–15)
BUN: 15 mg/dL (ref 6–20)
CO2: 25 mmol/L (ref 22–32)
Calcium: 9.1 mg/dL (ref 8.9–10.3)
Chloride: 103 mmol/L (ref 98–111)
Creatinine, Ser: 0.71 mg/dL (ref 0.44–1.00)
GFR calc Af Amer: >60 mL/min (ref >60)
GFR calc non Af Amer: >60 mL/min (ref >60)
Glucose, Bld: 91 mg/dL (ref 70–99)
Potassium: 3.5 mmol/L (ref 3.5–5.1)
Sodium: 139 mmol/L (ref 135–145)

## 2019-12-07 LAB — TROPONIN I (HIGH SENSITIVITY): Troponin I (High Sensitivity): <2 ng/L (ref <18)

## 2019-12-07 LAB — D-DIMER, QUANTITATIVE: D-Dimer, Quant: 0.37 ug/mL-FEU (ref 0.00–0.50)

## 2019-12-07 MED ORDER — KETOROLAC TROMETHAMINE 15 MG/ML IJ SOLN
15.0000 mg | Freq: Once | INTRAMUSCULAR | Status: AC
  Administered 2019-12-07: 15 mg via INTRAVENOUS
  Filled 2019-12-07: qty 1

## 2019-12-07 NOTE — ED Provider Notes (Signed)
[facility] Provider Note   CSN: [id number] Arrival date & time: 12/07/19  2117     History Chief Complaint  Patient presents with  . Chest Pain    Susan Schmitt is a 44 y.o. female.  The history is provided by the patient.  Chest Pain Pain location:  R chest Pain quality: aching   Pain radiates to:  Does not radiate Pain severity:  Mild Onset quality:  Gradual Timing:  Constant Progression:  Unchanged Chronicity:  New Context: breathing and movement   Relieved by:  Nothing Worsened by:  Nothing Associated symptoms: no abdominal pain, no anxiety, no back pain, no cough, no fever, no lower extremity edema, no nausea, no palpitations, no shortness of breath and no vomiting   Risk factors: no coronary artery disease, no diabetes mellitus, no high cholesterol, no hypertension and no prior DVT/PE        Past Medical History:  Diagnosis Date  . Hypothyroid   . Radiation    thyroid    There are no problems to display for this patient.   Past Surgical History:  Procedure Laterality Date  . THUMB ARTHROSCOPY    . TUBAL LIGATION       OB History   No obstetric history on file.     Family History  Problem Relation Age of Onset  . COPD Mother   . Depression Father     Social History   Tobacco Use  . Smoking status: Former Smoker    Types: Cigarettes  . Smokeless tobacco: Never Used  Substance Use Topics  . Alcohol use: Yes    Comment: social  . Drug use: No    Home Medications Prior to Admission medications   Medication Sig Start Date End Date Taking? Authorizing Provider  acetaminophen-codeine (TYLENOL #3) 300-30 MG tablet Take 1-2 tablets by mouth every 4 (four) hours as needed for moderate pain. 06/17/16   Triplett, Cari B, FNP  levothyroxine (SYNTHROID, LEVOTHROID) 100 MCG tablet Take 100 mcg by mouth daily.      [provider]  Multiple Vitamin (MULTIVITAMIN WITH MINERALS) TABS tablet Take 1 tablet by  mouth daily.    [provider]  predniSONE (STERAPRED UNI-PAK 21 TAB) 10 MG (21) TBPK tablet Take 6 tablets on day 1 Take 5 tablets on day 2 Take 4 tablets on day 3 Take 3 tablets on day 4 Take 2 tablets on day 5 Take 1 tablet on day 6 06/17/16   Sherrie George B, FNP    Allergies    Patient has no known allergies.  Review of Systems   Review of Systems  Constitutional: Negative for chills and fever.  HENT: Negative for ear pain and sore throat.   Eyes: Negative for pain and visual disturbance.  Respiratory: Negative for cough and shortness of breath.   Cardiovascular: Positive for chest pain. Negative for palpitations.  Gastrointestinal: Negative for abdominal pain, nausea and vomiting.  Genitourinary: Negative for dysuria and hematuria.  Musculoskeletal: Negative for arthralgias and back pain.  Skin: Negative for color change and rash.  Neurological: Negative for seizures and syncope.  All other systems reviewed and are negative.   Physical Exam Updated Vital Signs  ED Triage Vitals  Enc Vitals Group     BP 12/07/19 2127 (!) 141/94     Pulse Rate 12/07/19 2127 84     Resp 12/07/19 2127 (!) 28     Temp 12/07/19 2127 98.9 F (37.2 C)  Temp Source 12/07/19 2127 Oral     SpO2 12/07/19 2127 100 %     Weight --      Height --      Head Circumference --      Peak Flow --      Pain Score 12/07/19 2124 9     Pain Loc --      Pain Edu? --      Excl. in GC? --     Physical Exam Vitals and nursing note reviewed.  Constitutional:      General: She is not in acute distress.    Appearance: She is well-developed. She is not ill-appearing.  HENT:     Head: Normocephalic and atraumatic.  Eyes:     Conjunctiva/sclera: Conjunctivae normal.     Pupils: Pupils are equal, round, and reactive to light.  Cardiovascular:     Rate and Rhythm: Normal rate and regular rhythm.     Pulses:          Radial pulses are 2+ on the right side and 2+ on the left side.     Heart  sounds: No murmur.  Pulmonary:     Effort: Pulmonary effort is normal. No respiratory distress.     Breath sounds: Examination of the right-upper field reveals decreased breath sounds. Decreased breath sounds present. No wheezing, rhonchi or rales.  Abdominal:     Palpations: Abdomen is soft.     Tenderness: There is no abdominal tenderness.  Musculoskeletal:        General: Normal range of motion.     Cervical back: Normal range of motion and neck supple.     Right lower leg: No edema.     Left lower leg: No edema.  Skin:    General: Skin is warm and dry.     Capillary Refill: Capillary refill takes less than 2 seconds.  Neurological:     General: No focal deficit present.     Mental Status: She is alert.     ED Results / Procedures / Treatments   Labs (all labs ordered are listed, but only abnormal results are displayed) Labs Reviewed  CBC WITH DIFFERENTIAL/PLATELET - Abnormal; Notable for the following components:      Result Value   RBC 3.72 (*)    All other components within normal limits  BASIC METABOLIC PANEL  D-DIMER, QUANTITATIVE (NOT AT [facility])  TROPONIN I (HIGH SENSITIVITY)    EKG EKG Interpretation  Date/Time:  Thursday December 07 2019 21:27:46 EDT Ventricular Rate:  86 PR Interval:    QRS Duration: 96 QT Interval:  369 QTC Calculation: 442 R Axis:   69 Text Interpretation: Sinus rhythm Confirmed by Virgina Norfolk [phone]) on 12/07/2019 9:30:01 PM   Radiology DG Chest Portable 1 View  Result Date: 12/07/2019 CLINICAL DATA:  44 year old female with chest pain. EXAM: PORTABLE CHEST 1 VIEW COMPARISON:  Chest radiograph dated 04/19/2015. FINDINGS: The heart size and mediastinal contours are within normal limits. Both lungs are clear. The visualized skeletal structures are unremarkable. IMPRESSION: No active disease. Electronically Signed   By: Elgie Collard M.D.   On: 12/07/2019 22:17    Procedures Procedures (including critical care time)  Medications  Ordered in ED Medications  ketorolac (TORADOL) 15 MG/ML injection 15 mg (15 mg Intravenous Given 12/07/19 2253)    ED Course  I have reviewed the triage vital signs and the nursing notes.  Pertinent labs & imaging results that were available during my care of the patient  were reviewed by me and considered in my medical decision making (see chart for details).    MDM Rules/Calculators/A&P                      Daziya L Milliner is a 44 year old female who presents to the ED with chest pain.  Patient with overall no significant medical history.  Normal vitals.  No fever.  Patient states when she woke up she had some right upper back, right-sided chest pain.  Felt like she had paresthesias down her arm.  Denies any trauma.  No cough.  No shortness of breath.  Patient is PERC negative however given pleuritic chest pain, D-dimer was obtained that was also normal.  Doubt PE.  Chest x-ray showed no pneumothorax.  No infectious process.  Troponin normal.  EKG shows sinus rhythm.  No concern for ACS.  No cardiac risk factors.  No significant leukocytosis, anemia, electrolyte abnormality.  Overall suspect musculoskeletal type pain.  May be some pleurisy/pinched nerve.  Patient was given Toradol.  Recommend Tylenol, Motrin at home.  Given return precautions and discharged from the ED in good condition.  This chart was dictated using voice recognition software.  Despite best efforts to proofread,  errors can occur which can change the documentation meaning.    Final Clinical Impression(s) / ED Diagnoses Final diagnoses:  Nonspecific chest pain    Rx / DC Orders ED Discharge Orders    None       Virgina Norfolk, DO 12/07/19 2304

## 2019-12-07 NOTE — ED Triage Notes (Addendum)
Pt c/o right side CP started when she woke this am ~630am -numbness/tigling to right UE started ~12pm-pt appears anxious/hyperventilating/is crying-to tx area with steady gait

## 2019-12-07 NOTE — Discharge Instructions (Signed)
Take 600 mg of Motrin 3 times a day, 1000 mg of Tylenol 4 times a day for the next several days.

## 2019-12-11 MED ORDER — ONDANSETRON HCL 4 MG PO TABS: 4.0000 mg | ORAL_TABLET | Freq: Three times a day (TID) | ORAL | 0 refills | Status: DC | PRN

## 2019-12-11 MED ORDER — OXYCODONE-ACETAMINOPHEN 10-325 MG PO TABS: 1.0000 | ORAL_TABLET | ORAL | 0 refills | Status: DC | PRN

## 2019-12-11 NOTE — Addendum Note (Signed)
Addended by: Lenn Sink on: 12/11/2019 05:18 PM   Modules accepted: Orders

## 2019-12-12 ENCOUNTER — Encounter: Payer: Self-pay | Admitting: Podiatry

## 2019-12-12 DIAGNOSIS — M2021 Hallux rigidus, right foot: Secondary | ICD-10-CM | POA: Diagnosis not present

## 2019-12-12 HISTORY — PX: METATARSAL OSTEOTOMY WITH BUNIONECTOMY: SHX5662

## 2019-12-13 ENCOUNTER — Telehealth: Payer: Self-pay | Admitting: *Deleted

## 2019-12-13 NOTE — Telephone Encounter (Signed)
Pt states she had surgery yesterday and was going to have her 2nd COVID test. I called and asked if she was going for her 2nd COVID shot because she said test. Pt states she is getting her 2nd COVID shot today at 6:00pm and wants to know if Dr. Charlsie Merles is okay with that. I spoke with Dr. Charlsie Merles and informed that pt wanted me to ASK HIM if it was okay to get the 2nd COVID shot and Dr. Everlena Cooper. I called pt and informed.

## 2019-12-18 ENCOUNTER — Ambulatory Visit (INDEPENDENT_AMBULATORY_CARE_PROVIDER_SITE_OTHER): Payer: Managed Care, Other (non HMO) | Admitting: Podiatrist

## 2019-12-18 ENCOUNTER — Encounter: Payer: Self-pay | Admitting: Podiatry

## 2019-12-18 ENCOUNTER — Encounter: Payer: Self-pay | Admitting: Podiatrist

## 2019-12-18 ENCOUNTER — Other Ambulatory Visit: Payer: Self-pay

## 2019-12-18 ENCOUNTER — Ambulatory Visit (INDEPENDENT_AMBULATORY_CARE_PROVIDER_SITE_OTHER): Payer: Managed Care, Other (non HMO)

## 2019-12-18 VITALS — BP 118/71 | HR 88 | Temp 97.9°F | Resp 16

## 2019-12-18 DIAGNOSIS — M205X1 Other deformities of toe(s) (acquired), right foot: Secondary | ICD-10-CM

## 2019-12-18 NOTE — Patient Instructions (Signed)
Wean yourself out of your boot and into your surgical shoe in about a week-  You may get the foot wet as well.  Continue wearing the compressive ace wrap to keep the swelling out of your foot.  Call if any questions or concerns.

## 2019-12-18 NOTE — Progress Notes (Signed)
   Chief Complaint  Patient presents with  . Routine Post Op    DOS 12/12/2019 [place] Bunionectomy Bi-Planar Rt; pt stated, "Pain 4-5/10; I really haven't have any pain; not taking pain medicine now; I get a sharp pain in my Right Thigh"     Subjective: Patient presents today1 week status post foot surgery of the right foot.  . Patient denies nausea, vomiting, fevers, chills or night sweats.  Denies calf pain or tenderness to the operative side.  Relates a sharp pain in her thigh at times.    Objective:  Neurovascular status is intact with palpable pedal pulses DP and PT bilateral at 2+ out of 4. Neurological sensation is intact and unchanged as per prior to surgery. Excellent appearance of the postoperative foot is noted.   Incision is well coapted.  Minimal swelling present.  No ecchymosis noted.   Radiographic exam:  Well healing surgical foot-  Good alignment and position of the first metatarsal with pin fixation in place.    Assessment: Status post right foot surgery- biplanar [place] bunionectomy with pin fixation.   Plan:  Gave instructions that she may get the foot wet in the shower in 1 week.  She may also transition to a surgical shoe in a week as well.  She will remain out of work for another 3 weeks until she follow up with Dr. Charlsie Merles.

## 2019-12-20 ENCOUNTER — Other Ambulatory Visit: Payer: Self-pay | Admitting: Podiatry

## 2019-12-20 ENCOUNTER — Telehealth: Payer: Self-pay | Admitting: Podiatry

## 2019-12-20 ENCOUNTER — Encounter: Payer: Managed Care, Other (non HMO) | Admitting: Podiatry

## 2019-12-20 MED ORDER — OXYCODONE-ACETAMINOPHEN 10-325 MG PO TABS: 1.0000 | ORAL_TABLET | ORAL | 0 refills | Status: DC | PRN

## 2019-12-20 NOTE — Telephone Encounter (Signed)
Left message informing pt of Dr. Logan Bores order for the Percocet.

## 2019-12-20 NOTE — Progress Notes (Signed)
PRN postop pain 

## 2019-12-20 NOTE — Telephone Encounter (Signed)
Pt called saying that when seen on 12/18/19 that she requested a refill on her pain medication, DOS 12/12/19 [facility] [address]

## 2020-01-01 DIAGNOSIS — M79676 Pain in unspecified toe(s): Secondary | ICD-10-CM

## 2020-01-02 ENCOUNTER — Encounter: Payer: Self-pay | Admitting: Podiatry

## 2020-01-08 ENCOUNTER — Other Ambulatory Visit: Payer: Self-pay

## 2020-01-08 ENCOUNTER — Ambulatory Visit (INDEPENDENT_AMBULATORY_CARE_PROVIDER_SITE_OTHER): Payer: Managed Care, Other (non HMO) | Admitting: Podiatry

## 2020-01-08 ENCOUNTER — Encounter: Payer: Self-pay | Admitting: Podiatry

## 2020-01-08 ENCOUNTER — Ambulatory Visit (INDEPENDENT_AMBULATORY_CARE_PROVIDER_SITE_OTHER): Payer: Managed Care, Other (non HMO)

## 2020-01-08 DIAGNOSIS — Z9889 Other specified postprocedural states: Secondary | ICD-10-CM

## 2020-01-08 DIAGNOSIS — M205X1 Other deformities of toe(s) (acquired), right foot: Secondary | ICD-10-CM

## 2020-01-08 NOTE — Progress Notes (Signed)
Subjective:   Patient ID: Susan Schmitt, female   DOB: 44 y.o.   MRN: [id number]   HPI Patient states doing really well and states that she is having minimal discomfort and very pleased so far   ROS      Objective:  Physical Exam  Neurovascular status intact negative Denna Haggard' sign noted wound edges well coapted hallux in rectus position good range of motion no crepitus of the joint noted     Assessment:  Doing well post osteotomy right first metatarsal     Plan:  Reviewed condition recommended the continuation of range of motion and due to the fact it is mildly restricted I am sending for physical therapy.  I encouraged her to wear shoes that will allow her to bend the big toe joint but no jumping running or any other kind of intense activity.  Reappoint to recheck  X-rays indicate osteotomies healing well fixation in place joint congruence

## 2020-01-15 ENCOUNTER — Encounter: Payer: Managed Care, Other (non HMO) | Admitting: Podiatry

## 2020-01-17 ENCOUNTER — Encounter: Payer: Managed Care, Other (non HMO) | Admitting: Podiatry

## 2020-02-05 ENCOUNTER — Other Ambulatory Visit: Payer: Self-pay

## 2020-02-05 ENCOUNTER — Encounter: Payer: Self-pay | Admitting: Podiatry

## 2020-02-05 ENCOUNTER — Ambulatory Visit (INDEPENDENT_AMBULATORY_CARE_PROVIDER_SITE_OTHER): Payer: Managed Care, Other (non HMO) | Admitting: Podiatry

## 2020-02-05 ENCOUNTER — Ambulatory Visit (INDEPENDENT_AMBULATORY_CARE_PROVIDER_SITE_OTHER): Payer: Managed Care, Other (non HMO)

## 2020-02-05 VITALS — Temp 97.2°F

## 2020-02-05 DIAGNOSIS — M205X1 Other deformities of toe(s) (acquired), right foot: Secondary | ICD-10-CM | POA: Diagnosis not present

## 2020-02-07 NOTE — Progress Notes (Signed)
Subjective:   Patient ID: Susan Schmitt, female   DOB: 44 y.o.   MRN: [id number]   HPI Patient states overall doing well with achiness and soreness if she is on her foot too much and occasional numbing-like feeling   ROS      Objective:  Physical Exam  Neurovascular status intact negative Denna Haggard' sign noted with patient's right foot overall healing well wound edges well coapted with good range of motion of the first MPJ     Assessment:  Doing well post osteotomy first metatarsal right     Plan:  Advised on the importance of continued range of motion exercises and discussed rigid bottom shoes to use at this time.  Patient will be seen for [place] to recheck and is advised on the importance of walking  X-rays indicate that there is good healing of the osteotomy site good alignment noted no signs of pathology

## 2020-03-28 ENCOUNTER — Encounter: Payer: Self-pay | Admitting: Podiatry

## 2020-03-28 ENCOUNTER — Other Ambulatory Visit: Payer: Self-pay

## 2020-03-28 ENCOUNTER — Ambulatory Visit (INDEPENDENT_AMBULATORY_CARE_PROVIDER_SITE_OTHER): Payer: Managed Care, Other (non HMO) | Admitting: Podiatry

## 2020-03-28 ENCOUNTER — Ambulatory Visit (INDEPENDENT_AMBULATORY_CARE_PROVIDER_SITE_OTHER): Payer: Managed Care, Other (non HMO)

## 2020-03-28 VITALS — Temp 97.4°F

## 2020-03-28 DIAGNOSIS — M205X1 Other deformities of toe(s) (acquired), right foot: Secondary | ICD-10-CM | POA: Diagnosis not present

## 2020-03-28 NOTE — Progress Notes (Signed)
Subjective:   Patient ID: Susan Schmitt, female   DOB: 44 y.o.   MRN: [id number]   HPI Patient states doing great with surgery and just started to wear heels again   ROS      Objective:  Physical Exam  Neurovascular status intact with patient found to have excellent healing first metatarsal right with good range of motion with no crepitus of the joint     Assessment:  Doing very well post hallux limitus repair right     Plan:  Advised this patient on continued range of motion exercises anti-inflammatory support therapy and patient is discharged but will be seen back as needed  X-rays indicate osteotomy looks good bone spur resection looks good joint congruence and open currently with fixation in place

## 2020-10-03 ENCOUNTER — Emergency Department (HOSPITAL_BASED_OUTPATIENT_CLINIC_OR_DEPARTMENT_OTHER): Payer: Managed Care, Other (non HMO)

## 2020-10-03 ENCOUNTER — Other Ambulatory Visit: Payer: Self-pay

## 2020-10-03 ENCOUNTER — Encounter (HOSPITAL_BASED_OUTPATIENT_CLINIC_OR_DEPARTMENT_OTHER): Payer: Self-pay | Admitting: *Deleted

## 2020-10-03 ENCOUNTER — Emergency Department (HOSPITAL_BASED_OUTPATIENT_CLINIC_OR_DEPARTMENT_OTHER)
Admission: EM | Admit: 2020-10-03 | Discharge: 2020-10-04 | Disposition: A | Discharge status: Home / Self Care | Payer: Managed Care, Other (non HMO) | Attending: Emergency Medicine | Admitting: Emergency Medicine

## 2020-10-03 DIAGNOSIS — R0602 Shortness of breath: Secondary | ICD-10-CM

## 2020-10-03 DIAGNOSIS — U071 COVID-19: Secondary | ICD-10-CM | POA: Diagnosis not present

## 2020-10-03 DIAGNOSIS — Z87891 Personal history of nicotine dependence: Secondary | ICD-10-CM | POA: Insufficient documentation

## 2020-10-03 DIAGNOSIS — E039 Hypothyroidism, unspecified: Secondary | ICD-10-CM | POA: Diagnosis not present

## 2020-10-03 DIAGNOSIS — Z79899 Other long term (current) drug therapy: Secondary | ICD-10-CM | POA: Insufficient documentation

## 2020-10-03 LAB — COMPREHENSIVE METABOLIC PANEL
ALT: 15 U/L (ref 0–44)
AST: 20 U/L (ref 15–41)
Albumin: 4.1 g/dL (ref 3.5–5.0)
Alkaline Phosphatase: 52 U/L (ref 38–126)
Anion gap: 10 (ref 5–15)
BUN: 12 mg/dL (ref 6–20)
CO2: 24 mmol/L (ref 22–32)
Calcium: 9.4 mg/dL (ref 8.9–10.3)
Chloride: 106 mmol/L (ref 98–111)
Creatinine, Ser: 0.77 mg/dL (ref 0.44–1.00)
GFR, Estimated: >60 mL/min (ref >60)
Glucose, Bld: 101 mg/dL — ABNORMAL HIGH (ref 70–99)
Potassium: 3.4 mmol/L — ABNORMAL LOW (ref 3.5–5.1)
Sodium: 140 mmol/L (ref 135–145)
Total Bilirubin: <0.1 mg/dL — ABNORMAL LOW (ref 0.3–1.2)
Total Protein: 7.1 g/dL (ref 6.5–8.1)

## 2020-10-03 LAB — CBC WITH DIFFERENTIAL/PLATELET
Abs Immature Granulocytes: 0.04 10*3/uL (ref 0.00–0.07)
Basophils Absolute: 0.1 10*3/uL (ref 0.0–0.1)
Basophils Relative: 1 %
Eosinophils Absolute: 0.2 10*3/uL (ref 0.0–0.5)
Eosinophils Relative: 2 %
HCT: 36.3 % (ref 36.0–46.0)
Hemoglobin: 12.7 g/dL (ref 12.0–15.0)
Immature Granulocytes: 0 %
Lymphocytes Relative: 38 %
Lymphs Abs: 3.8 10*3/uL (ref 0.7–4.0)
MCH: 33 pg (ref 26.0–34.0)
MCHC: 35 g/dL (ref 30.0–36.0)
MCV: 94.3 fL (ref 80.0–100.0)
Monocytes Absolute: 0.8 10*3/uL (ref 0.1–1.0)
Monocytes Relative: 8 %
Neutro Abs: 5 10*3/uL (ref 1.7–7.7)
Neutrophils Relative %: 51 %
Platelets: 235 10*3/uL (ref 150–400)
RBC: 3.85 MIL/uL — ABNORMAL LOW (ref 3.87–5.11)
RDW: 11.7 % (ref 11.5–15.5)
WBC: 9.9 10*3/uL (ref 4.0–10.5)
nRBC: 0 % (ref 0.0–0.2)

## 2020-10-03 MED ORDER — HYDROCOD POLST-CPM POLST ER 10-8 MG/5ML PO SUER: 5.0000 mL | Freq: Every evening | ORAL | 0 refills | Status: DC | PRN

## 2020-10-03 MED ORDER — ONDANSETRON HCL 4 MG PO TABS: 4.0000 mg | ORAL_TABLET | Freq: Three times a day (TID) | ORAL | 0 refills | Status: DC | PRN

## 2020-10-03 MED ORDER — BENZONATATE 100 MG PO CAPS: 100.0000 mg | ORAL_CAPSULE | Freq: Three times a day (TID) | ORAL | 0 refills | Status: AC

## 2020-10-03 MED ORDER — IOHEXOL 350 MG/ML SOLN
100.0000 mL | Freq: Once | INTRAVENOUS | Status: AC | PRN
  Administered 2020-10-03: 100 mL via INTRAVENOUS

## 2020-10-03 MED ORDER — ALBUTEROL SULFATE HFA 108 (90 BASE) MCG/ACT IN AERS
2.0000 | INHALATION_SPRAY | Freq: Once | RESPIRATORY_TRACT | Status: AC
  Administered 2020-10-03: 2 via RESPIRATORY_TRACT
  Filled 2020-10-03: qty 6.7

## 2020-10-03 MED ORDER — HYDROCOD POLST-CPM POLST ER 10-8 MG/5ML PO SUER
5.0000 mL | Freq: Once | ORAL | Status: AC
  Administered 2020-10-03: 5 mL via ORAL
  Filled 2020-10-03: qty 5

## 2020-10-03 MED ORDER — ONDANSETRON 4 MG PO TBDP
4.0000 mg | ORAL_TABLET | Freq: Once | ORAL | Status: AC
  Administered 2020-10-03: 4 mg via ORAL
  Filled 2020-10-03: qty 1

## 2020-10-03 NOTE — Discharge Instructions (Signed)
You were given Tessalon to help with your cough.  Take as directed.  You are also given Zofran to help with your nausea.  You were given a prescription for Tussionex as well for your cough however this contains a narcotic so would recommend only taking this at night and do not drive, drink alcohol or operate machinery while taking this medication as it can make you very drowsy.  Additionally, you can use the albuterol inhaler every 4-6 hours as needed for shortness of breath  You were given a referral to the post-COVID care center at [facility].  Please call the office to schedule an appointment for follow-up.  Please return to the emergency department for any new or worsening symptoms in the meantime

## 2020-10-03 NOTE — ED Triage Notes (Signed)
Covid Positive patient. Tested January 6th.  Here with SOB.

## 2020-10-03 NOTE — ED Provider Notes (Signed)
[facility] EMERGENCY DEPARTMENT Provider Note   CSN: [id number] Arrival date & time: 10/03/20  2035     History No chief complaint on file.   Jeffie L Whitenight is a 45 y.o. female.  HPI   Pt is a 45 y/o female with a h/o hypothyroid, who presents to the ED today for eval of covid sxs. States that she was diagnosed with COVID 2 weeks ago and initially thought she was feeling better but states she is here because she is still coughing up mucous, feeling nauseated, having sob, and having post tussive emesis. States she has had sob since she was diagnosed with covid. She feels like it has worsened since onset. Denies any hemoptysis, ble swelling, calf pain. Denies any significant chest pain. Denies fevers. Denies h/o VTE. Is not on birth control. Denies any recent surgeries or hospital admissions.  Past Medical History:  Diagnosis Date  . Hypothyroid   . Radiation    thyroid    Patient Active Problem List   Diagnosis Date Noted  . Sacroiliac joint pain 01/12/2018  . Lumbar pain 10/15/2017  . Prolapsed lumbar disc 10/15/2017  . Patellofemoral stress syndrome 09/17/2017  . Genital herpes simplex 02/27/2016  . History of female sterilization 01/30/2016  . Hypothyroidism 01/30/2016    Past Surgical History:  Procedure Laterality Date  . THUMB ARTHROSCOPY    . TUBAL LIGATION       OB History   No obstetric history on file.     Family History  Problem Relation Age of Onset  . COPD Mother   . Depression Father     Social History   Tobacco Use  . Smoking status: Former Smoker    Types: Cigarettes  . Smokeless tobacco: Never Used  Vaping Use  . Vaping Use: Never used  Substance Use Topics  . Alcohol use: Yes    Comment: social  . Drug use: No    Home Medications Prior to Admission medications   Medication Sig Start Date End Date Taking? Authorizing Provider  acetaminophen-codeine (TYLENOL #3) 300-30 MG tablet Take 1-2 tablets by mouth every 4 (four)  hours as needed for moderate pain. 06/17/16  Yes Triplett, Cari B, FNP  benzonatate (TESSALON) 100 MG capsule Take 1 capsule (100 mg total) by mouth every 8 (eight) hours for 5 days. 10/03/20 10/08/20 Yes Maraki Macquarrie S, PA-C  chlorpheniramine-HYDROcodone (TUSSIONEX PENNKINETIC ER) 10-8 MG/5ML SUER Take 5 mLs by mouth at bedtime as needed for cough. 10/03/20  Yes Tor Tsuda S, PA-C  levothyroxine (SYNTHROID) 75 MCG tablet Take 75 mcg by mouth daily. 01/21/20  Yes [provider]  Multiple Vitamin (MULTIVITAMIN WITH MINERALS) TABS tablet Take 1 tablet by mouth daily.   Yes [provider]  ondansetron (ZOFRAN) 4 MG tablet Take 1 tablet (4 mg total) by mouth every 8 (eight) hours as needed for nausea or vomiting. 10/03/20  Yes Anum Palecek S, PA-C    Allergies    Patient has no known allergies.  Review of Systems   Review of Systems  Constitutional: Negative for fever.  HENT: Positive for congestion and postnasal drip. Negative for ear pain and sore throat.   Eyes: Negative for visual disturbance.  Respiratory: Positive for cough and shortness of breath.   Cardiovascular: Negative for chest pain.  Gastrointestinal: Positive for nausea. Negative for abdominal pain, constipation, diarrhea and vomiting.  Genitourinary: Negative for dysuria and hematuria.  Musculoskeletal: Negative for back pain.  Skin: Negative for rash.  Neurological: Negative  for syncope and headaches.  All other systems reviewed and are negative.   Physical Exam Updated Vital Signs BP 97/66 (BP Location: Right Arm)   Pulse 69   Temp 98.3 F (36.8 C) (Oral)   Resp 14   Ht 5\' 5"  (1.651 m)   Wt 82.3 kg   SpO2 100%   BMI 30.19 kg/m   Physical Exam Vitals and nursing note reviewed.  Constitutional:      General: She is not in acute distress.    Appearance: She is well-developed and well-nourished.  HENT:     Head: Normocephalic and atraumatic.  Eyes:     Conjunctiva/sclera: Conjunctivae  normal.  Cardiovascular:     Rate and Rhythm: Normal rate and regular rhythm.     Heart sounds: Normal heart sounds. No murmur heard.   Pulmonary:     Effort: Pulmonary effort is normal. No respiratory distress.     Breath sounds: Normal breath sounds. No wheezing, rhonchi or rales.     Comments: Persistent dry cough Abdominal:     General: Bowel sounds are normal.     Palpations: Abdomen is soft.     Tenderness: There is no abdominal tenderness. There is no guarding or rebound.  Musculoskeletal:        General: No edema.     Cervical back: Neck supple.  Skin:    General: Skin is warm and dry.  Neurological:     Mental Status: She is alert.  Psychiatric:        Mood and Affect: Mood and affect normal.     ED Results / Procedures / Treatments   Labs (all labs ordered are listed, but only abnormal results are displayed) Labs Reviewed  CBC WITH DIFFERENTIAL/PLATELET - Abnormal; Notable for the following components:      Result Value   RBC 3.85 (*)    All other components within normal limits  COMPREHENSIVE METABOLIC PANEL - Abnormal; Notable for the following components:   Potassium 3.4 (*)    Glucose, Bld 101 (*)    Total Bilirubin <0.1 (*)    All other components within normal limits    EKG None  Radiology CT Angio Chest PE W and/or Wo Contrast  Result Date: 10/03/2020 CLINICAL DATA:  COVID positive patient, shortness of breath EXAM: CT ANGIOGRAPHY CHEST WITH CONTRAST TECHNIQUE: Multidetector CT imaging of the chest was performed using the standard protocol during bolus administration of intravenous contrast. Multiplanar CT image reconstructions and MIPs were obtained to evaluate the vascular anatomy. CONTRAST:  10/05/2020 OMNIPAQUE IOHEXOL 350 MG/ML SOLN COMPARISON:  None. FINDINGS: Cardiovascular: There is a optimal opacification of the pulmonary arteries. There is no central,segmental, or subsegmental filling defects within the pulmonary arteries. The heart is normal in  size. No pericardial effusion or thickening. No evidence right heart strain. There is normal three-vessel brachiocephalic anatomy without proximal stenosis. The thoracic aorta is normal in appearance. Mediastinum/Nodes: No hilar, mediastinal, or axillary adenopathy. Thyroid gland, trachea, and esophagus demonstrate no significant findings. Lungs/Pleura: Minimal ground-glass opacity seen at the posterior lung base. No pleural effusion or pneumothorax. No airspace consolidation. Upper Abdomen: No acute abnormalities present in the visualized portions of the upper abdomen. Musculoskeletal: No chest wall abnormality. No acute or significant osseous findings. Review of the MIP images confirms the above findings. IMPRESSION: No central, segmental, or subsegmental pulmonary embolism. Minimal bibasilar ground-glass opacity, likely atelectasis. Electronically Signed   By: M.D.   On: 10/03/2020 23:13   DG Chest Portable  1 View  Result Date: 10/03/2020 CLINICAL DATA:  Shortness of breath, COVID positive EXAM: PORTABLE CHEST 1 VIEW COMPARISON:  December 07, 2019 FINDINGS: The heart size and mediastinal contours are within normal limits. Both lungs are clear. The visualized skeletal structures are unremarkable. IMPRESSION: No active disease. Electronically Signed   By: Jonna ClarkBindu  Avutu M.D.   On: 10/03/2020 21:24    Procedures Procedures (including critical care time)  Medications Ordered in ED Medications  chlorpheniramine-HYDROcodone (TUSSIONEX) 10-8 MG/5ML suspension 5 mL (5 mLs Oral Given 10/03/20 2157)  ondansetron (ZOFRAN-ODT) disintegrating tablet 4 mg (4 mg Oral Given 10/03/20 2157)  albuterol (VENTOLIN HFA) 108 (90 Base) MCG/ACT inhaler 2 puff (2 puffs Inhalation Given 10/03/20 2151)  iohexol (OMNIPAQUE) 350 MG/ML injection 100 mL (100 mLs Intravenous Contrast Given 10/03/20 2258)    ED Course  I have reviewed the triage vital signs and the nursing notes.  Pertinent labs & imaging results that  were available during my care of the patient were reviewed by me and considered in my medical decision making (see chart for details).    MDM Rules/Calculators/A&P                          45 year old female with 2-week history of COVID symptoms presenting for evaluation of shortness of breath, persistent cough, posttussive emesis.  Reviewed/interpreted labs.  CBC unremarkable.  CMP with mild hypokalemia otherwise reassuring.  Chest x-ray reassuring.  CTA chest does not show any evidence of the PE but does show some groundglass opacities consistent with COVID.  Patient was ambulated in the department and sats did not drop below 92% on room air.  Her vital signs are reassuring.  She is tolerating p.o. here in the ED.  She was given a dose of cough medication and Zofran has had no further episodes of vomiting and feels improvement on my reassessment.  Will give Rx for Tessalon, albuterol, Tussionex and Zofran for home.  Have advised that she follow-up with post-COVID clinic.  Have advised on strict return precautions.  She does not appear to require admission at this time and feel she is appropriate for discharge.  She voices understanding of this plan and reasons to return. all Questions answered.  Patient stable for discharge  Tyara L Latorre was evaluated in Emergency Department on 10/03/2020 for the symptoms described in the history of present illness. She was evaluated in the context of the global COVID-19 pandemic, which necessitated consideration that the patient might be at risk for infection with the SARS-CoV-2 virus that causes COVID-19. Institutional protocols and algorithms that pertain to the evaluation of patients at risk for COVID-19 are in a state of rapid change based on information released by regulatory bodies including the CDC and federal and state organizations. These policies and algorithms were followed during the patient's care in the ED.   Final Clinical Impression(s) / ED  Diagnoses Final diagnoses:  COVID  Shortness of breath    Rx / DC Orders ED Discharge Orders         Ordered    chlorpheniramine-HYDROcodone (TUSSIONEX PENNKINETIC ER) 10-8 MG/5ML SUER  At bedtime PRN        10/03/20 2357    benzonatate (TESSALON) 100 MG capsule  Every 8 hours        10/03/20 2357    ondansetron (ZOFRAN) 4 MG tablet  Every 8 hours PRN        10/03/20 2357  Karrie Meres, PA-C 10/03/20 2357    Charlynne Pander, MD 10/04/20 [phone]

## 2020-11-08 ENCOUNTER — Other Ambulatory Visit (HOSPITAL_COMMUNITY)
Admission: RE | Admit: 2020-11-08 | Discharge: 2020-11-08 | Disposition: A | Discharge status: Home / Self Care | Payer: Managed Care, Other (non HMO) | Source: Ambulatory Visit | Attending: Family | Admitting: Family

## 2020-11-08 ENCOUNTER — Other Ambulatory Visit: Payer: Self-pay

## 2020-11-08 ENCOUNTER — Telehealth: Payer: Self-pay | Admitting: General Practice

## 2020-11-08 ENCOUNTER — Other Ambulatory Visit: Payer: Self-pay | Admitting: Family

## 2020-11-08 ENCOUNTER — Ambulatory Visit: Payer: Managed Care, Other (non HMO) | Admitting: Family

## 2020-11-08 ENCOUNTER — Encounter: Payer: Self-pay | Admitting: Family

## 2020-11-08 VITALS — BP 110/70 | HR 66 | Temp 97.9°F | Ht 65.0 in | Wt 177.4 lb

## 2020-11-08 DIAGNOSIS — Z0000 Encounter for general adult medical examination without abnormal findings: Secondary | ICD-10-CM | POA: Diagnosis not present

## 2020-11-08 DIAGNOSIS — R87610 Atypical squamous cells of undetermined significance on cytologic smear of cervix (ASC-US): Secondary | ICD-10-CM | POA: Diagnosis not present

## 2020-11-08 DIAGNOSIS — E039 Hypothyroidism, unspecified: Secondary | ICD-10-CM

## 2020-11-08 DIAGNOSIS — Z124 Encounter for screening for malignant neoplasm of cervix: Secondary | ICD-10-CM | POA: Insufficient documentation

## 2020-11-08 DIAGNOSIS — Z1151 Encounter for screening for human papillomavirus (HPV): Secondary | ICD-10-CM | POA: Insufficient documentation

## 2020-11-08 DIAGNOSIS — Z23 Encounter for immunization: Secondary | ICD-10-CM

## 2020-11-08 DIAGNOSIS — Z1231 Encounter for screening mammogram for malignant neoplasm of breast: Secondary | ICD-10-CM | POA: Diagnosis not present

## 2020-11-08 DIAGNOSIS — Z13220 Encounter for screening for lipoid disorders: Secondary | ICD-10-CM

## 2020-11-08 DIAGNOSIS — R87810 Cervical high risk human papillomavirus (HPV) DNA test positive: Secondary | ICD-10-CM | POA: Insufficient documentation

## 2020-11-08 DIAGNOSIS — Z113 Encounter for screening for infections with a predominantly sexual mode of transmission: Secondary | ICD-10-CM | POA: Diagnosis not present

## 2020-11-08 LAB — CBC WITH DIFFERENTIAL/PLATELET
Basophils Absolute: 0.1 10*3/uL (ref 0.0–0.1)
Basophils Relative: 1.1 % (ref 0.0–3.0)
Eosinophils Absolute: 0.3 10*3/uL (ref 0.0–0.7)
Eosinophils Relative: 4.7 % (ref 0.0–5.0)
HCT: 35.9 % — ABNORMAL LOW (ref 36.0–46.0)
Hemoglobin: 12.2 g/dL (ref 12.0–15.0)
Lymphocytes Relative: 39.5 % (ref 12.0–46.0)
Lymphs Abs: 2.3 10*3/uL (ref 0.7–4.0)
MCHC: 34 g/dL (ref 30.0–36.0)
MCV: 96.1 fl (ref 78.0–100.0)
Monocytes Absolute: 0.5 10*3/uL (ref 0.1–1.0)
Monocytes Relative: 7.8 % (ref 3.0–12.0)
Neutro Abs: 2.7 10*3/uL (ref 1.4–7.7)
Neutrophils Relative %: 46.9 % (ref 43.0–77.0)
Platelets: 205 10*3/uL (ref 150.0–400.0)
RBC: 3.74 Mil/uL — ABNORMAL LOW (ref 3.87–5.11)
RDW: 12.1 % (ref 11.5–15.5)
WBC: 5.8 10*3/uL (ref 4.0–10.5)

## 2020-11-08 LAB — COMPREHENSIVE METABOLIC PANEL
ALT: 9 U/L (ref 0–35)
AST: 13 U/L (ref 0–37)
Albumin: 4.3 g/dL (ref 3.5–5.2)
Alkaline Phosphatase: 54 U/L (ref 39–117)
BUN: 13 mg/dL (ref 6–23)
CO2: 32 mEq/L (ref 19–32)
Calcium: 9.2 mg/dL (ref 8.4–10.5)
Chloride: 104 mEq/L (ref 96–112)
Creatinine, Ser: 0.7 mg/dL (ref 0.40–1.20)
GFR: 105.18 mL/min (ref >60.00)
Glucose, Bld: 87 mg/dL (ref 70–99)
Potassium: 4.1 mEq/L (ref 3.5–5.1)
Sodium: 142 mEq/L (ref 135–145)
Total Bilirubin: 0.4 mg/dL (ref 0.2–1.2)
Total Protein: 7 g/dL (ref 6.0–8.3)

## 2020-11-08 LAB — LIPID PANEL
Cholesterol: 159 mg/dL (ref 0–200)
HDL: 51.6 mg/dL (ref >39.00)
LDL Cholesterol: 95 mg/dL (ref 0–99)
NonHDL: 107.12
Total CHOL/HDL Ratio: 3
Triglycerides: 60 mg/dL (ref 0.0–149.0)
VLDL: 12 mg/dL (ref 0.0–40.0)

## 2020-11-08 LAB — TSH: TSH: 12.59 u[IU]/mL — ABNORMAL HIGH (ref 0.35–4.50)

## 2020-11-08 MED ORDER — LEVOTHYROXINE SODIUM 100 MCG PO TABS: 100.0000 ug | ORAL_TABLET | Freq: Every day | ORAL | 1 refills | Status: DC

## 2020-11-08 MED ORDER — FLUTICASONE PROPIONATE 50 MCG/ACT NA SUSP: 2.0000 | Freq: Every day | NASAL | 6 refills | Status: DC

## 2020-11-08 NOTE — Telephone Encounter (Signed)
Please call and get this message clarified. ??? Not sure what any of this means-

## 2020-11-08 NOTE — Telephone Encounter (Signed)
PAP released as requested by lab.

## 2020-11-08 NOTE — Progress Notes (Signed)
Susan Schmitt is a 45 y.o. female with the following history as recorded in Epic[employer]:  Patient Active Problem List   Diagnosis Date Noted  . Sacroiliac joint pain 01/12/2018  . Lumbar pain 10/15/2017  . Prolapsed lumbar disc 10/15/2017  . Patellofemoral stress syndrome 09/17/2017  . Genital herpes simplex 02/27/2016  . History of female sterilization 01/30/2016  . Hypothyroidism 01/30/2016    Current Outpatient Medications  Medication Sig Dispense Refill  . fluticasone (FLONASE) 50 MCG/ACT nasal spray Place 2 sprays into both nostrils daily. 16 g 6  . levothyroxine (SYNTHROID) 75 MCG tablet Take 75 mcg by mouth daily.    . Multiple Vitamin (MULTIVITAMIN WITH MINERALS) TABS tablet Take 1 tablet by mouth daily.     No current facility-administered medications for this visit.    Allergies: Patient has no known allergies.  Past Medical History:  Diagnosis Date  . Hypothyroid   . Radiation    thyroid    Past Surgical History:  Procedure Laterality Date  . THUMB ARTHROSCOPY    . TUBAL LIGATION      Family History  Problem Relation Age of Onset  . COPD Mother   . Depression Father     Social History   Tobacco Use  . Smoking status: Former Smoker    Types: Cigarettes  . Smokeless tobacco: Never Used  Substance Use Topics  . Alcohol use: Yes    Comment: social    Subjective:  Patient presents today as a new patient; LMP- end of January/ lasted for 3 weeks History of hypothyroidism- last dosage checked in 75 mcg;   Review of Systems  Constitutional: Negative.   HENT: Negative.   Eyes: Negative.   Respiratory: Negative.   Cardiovascular: Negative.   Gastrointestinal: Negative.   Genitourinary: Negative.   Musculoskeletal: Negative.   Skin: Negative.   Neurological: Negative.   Endo/Heme/Allergies: Negative.   Psychiatric/Behavioral: Negative.      Objective:  Vitals:   11/08/20 0933  BP: 110/70  Pulse: 66  Temp: 97.9 F (36.6 C)  TempSrc: Oral   SpO2: 97%  Weight: 177 lb 6.4 oz (80.5 kg)  Height: 5' 5"  (1.651 m)    General: Well developed, well nourished, in no acute distress  Skin : Warm and dry.  Head: Normocephalic and atraumatic  Eyes: Sclera and conjunctiva clear; pupils round and reactive to light; extraocular movements intact  Ears: External normal; canals clear; tympanic membranes normal  Oropharynx: Pink, supple. No suspicious lesions  Neck: Supple without thyromegaly, adenopathy  Lungs: Respirations unlabored; clear to auscultation bilaterally without wheeze, rales, rhonchi  CVS exam: normal rate and regular rhythm.  Abdomen: Soft; nontender; nondistended; normoactive bowel sounds; no masses or hepatosplenomegaly  Musculoskeletal: No deformities; no active joint inflammation  Extremities: No edema, cyanosis, clubbing  Vessels: Symmetric bilaterally  Neurologic: Alert and oriented; speech intact; face symmetrical; moves all extremities well; CNII-XII intact without focal deficit  Assessment:  1. PE (physical exam), annual   2. Needs flu shot   3. Hypothyroidism, unspecified type   4. Screening mammogram for breast cancer   5. Lipid screening   6. Cervical cancer screening     Plan:  Age appropriate preventive healthcare needs addressed; encouraged regular eye doctor and dental exams; encouraged regular exercise; will update labs and refills as needed today; follow-up to be determined;  This visit occurred during the SARS-CoV-2 public health emergency.  Safety protocols were in place, including screening questions prior to the visit, additional usage of staff  PPE, and extensive cleaning of exam room while observing appropriate contact time as indicated for disinfecting solutions.     No follow-ups on file.  Orders Placed This Encounter  Procedures  . MM Digital Screening    Standing Status:   Future    Standing Expiration Date:   11/08/2021    Order Specific Question:   Reason for Exam (SYMPTOM  OR DIAGNOSIS  REQUIRED)    Answer:   screening mammogram    Order Specific Question:   Is the patient pregnant?    Answer:   No    Order Specific Question:   Preferred imaging location?    Answer:   [facility]  . Flu Vaccine QUAD 6+ mos PF IM (Fluarix Quad PF)  . CBC with Differential/Platelet    Standing Status:   Future    Number of Occurrences:   1    Standing Expiration Date:   11/08/2021  . Comp Met (CMET)    Standing Status:   Future    Number of Occurrences:   1    Standing Expiration Date:   11/08/2021  . Lipid panel    Standing Status:   Future    Number of Occurrences:   1    Standing Expiration Date:   11/08/2021  . TSH    Standing Status:   Future    Number of Occurrences:   1    Standing Expiration Date:   11/08/2021    Requested Prescriptions   Signed Prescriptions Disp Refills  . fluticasone (FLONASE) 50 MCG/ACT nasal spray 16 g 6    Sig: Place 2 sprays into both nostrils daily.

## 2020-11-08 NOTE — Telephone Encounter (Signed)
Mary from [facility] has called requesting that the future PAP request be moved up  [place]: [phone]

## 2020-11-11 ENCOUNTER — Encounter (HOSPITAL_BASED_OUTPATIENT_CLINIC_OR_DEPARTMENT_OTHER): Payer: Self-pay | Admitting: Radiology

## 2020-11-11 ENCOUNTER — Ambulatory Visit (HOSPITAL_BASED_OUTPATIENT_CLINIC_OR_DEPARTMENT_OTHER)
Admission: RE | Admit: 2020-11-11 | Discharge: 2020-11-11 | Disposition: A | Discharge status: Home / Self Care | Payer: Managed Care, Other (non HMO) | Source: Ambulatory Visit | Attending: Family | Admitting: Family

## 2020-11-11 DIAGNOSIS — Z1231 Encounter for screening mammogram for malignant neoplasm of breast: Secondary | ICD-10-CM | POA: Diagnosis present

## 2020-11-14 LAB — CYTOLOGY - PAP
Chlamydia: NEGATIVE
Comment: NEGATIVE
Comment: NEGATIVE
Comment: NEGATIVE
Comment: NEGATIVE
Comment: NORMAL
Diagnosis: UNDETERMINED — AB
HPV 16: NEGATIVE
HPV 18 / 45: NEGATIVE
High risk HPV: POSITIVE — AB
Neisseria Gonorrhea: NEGATIVE
Trichomonas: NEGATIVE

## 2020-11-21 ENCOUNTER — Telehealth: Payer: Self-pay | Admitting: General Practice

## 2020-11-21 NOTE — Telephone Encounter (Signed)
Please call patient with lab results

## 2020-11-22 ENCOUNTER — Other Ambulatory Visit: Payer: Self-pay | Admitting: Family

## 2020-11-22 DIAGNOSIS — R87618 Other abnormal cytological findings on specimens from cervix uteri: Secondary | ICD-10-CM

## 2020-11-22 NOTE — Telephone Encounter (Signed)
Patient returned call. She can be reached at [phone]

## 2020-11-23 ENCOUNTER — Encounter: Payer: Self-pay | Admitting: Family

## 2020-11-25 NOTE — Telephone Encounter (Signed)
Please call her and get clarification about the e-mail she sent on Saturday; can she tell us more about the "throbbing pain"- where on her left side/ related to urination? Any nausea or vomiting?  Please also make sure she is aware that we do not monitor e-mails on the weekend and to call and speak to a nurse if she has concerns on a Saturday or Sunday.

## 2020-11-25 NOTE — Telephone Encounter (Signed)
Called pt to gather some more information. Pt reports left side pain starting Saturday night. It is more of a constant discomfort with some nausea. Pt reports that it is more intense at night. Since this is a new issue I recommended that she come to the office for evaluation of the area. She stated understanding and is now scheduled for  11 am on tomorrow 11/26/20.   We will see her then.

## 2020-11-26 ENCOUNTER — Ambulatory Visit: Payer: Managed Care, Other (non HMO) | Admitting: Family

## 2020-11-26 ENCOUNTER — Encounter: Payer: Self-pay | Admitting: Family

## 2020-11-26 ENCOUNTER — Other Ambulatory Visit: Payer: Self-pay

## 2020-11-26 ENCOUNTER — Other Ambulatory Visit: Payer: Self-pay | Admitting: Family

## 2020-11-26 VITALS — BP 110/70 | HR 69 | Temp 98.5°F | Ht 65.0 in | Wt 174.6 lb

## 2020-11-26 DIAGNOSIS — R1032 Left lower quadrant pain: Secondary | ICD-10-CM

## 2020-11-26 NOTE — Progress Notes (Signed)
Susan Schmitt is a 45 y.o. female with the following history as recorded in EpicCare:  Patient Active Problem List   Diagnosis Date Noted  . Sacroiliac joint pain 01/12/2018  . Lumbar pain 10/15/2017  . Prolapsed lumbar disc 10/15/2017  . Patellofemoral stress syndrome 09/17/2017  . Genital herpes simplex 02/27/2016  . History of female sterilization 01/30/2016  . Hypothyroidism 01/30/2016    Current Outpatient Medications  Medication Sig Dispense Refill  . fluticasone (FLONASE) 50 MCG/ACT nasal spray Place 2 sprays into both nostrils daily. 16 g 6  . levothyroxine (SYNTHROID) 100 MCG tablet Take 1 tablet (100 mcg total) by mouth daily. 30 tablet 1  . Multiple Vitamin (MULTIVITAMIN WITH MINERALS) TABS tablet Take 1 tablet by mouth daily.     No current facility-administered medications for this visit.    Allergies: Patient has no known allergies.  Past Medical History:  Diagnosis Date  . Hypothyroid   . Radiation    thyroid    Past Surgical History:  Procedure Laterality Date  . THUMB ARTHROSCOPY    . TUBAL LIGATION      Family History  Problem Relation Age of Onset  . COPD Mother   . Depression Father   . Breast cancer Paternal Grandmother     Social History   Tobacco Use  . Smoking status: Former Smoker    Types: Cigarettes  . Smokeless tobacco: Never Used  Substance Use Topics  . Alcohol use: Yes    Comment: social    Subjective:   LLQ pain started Friday evening/ Saturday; feels like intensity and frequency of pain has worsened in the past few days; no changes in bowel movements; no known fever; no burning on urination or frequency of urination;  Notes that last period was abnormal- 3 weeks in January; prior had gone a whole year without a period; denies any chance of being pregnant;   Objective:  Vitals:   11/26/20 1105  BP: 110/70  Pulse: 69  Temp: 98.5 F (36.9 C)  TempSrc: Oral  SpO2: 99%  Weight: 174 lb 9.6 oz (79.2 kg)  Height: 5\' 5"  (1.651  m)    General: Well developed, well nourished, in no acute distress  Skin : Warm and dry.  Head: Normocephalic and atraumatic  Eyes: Sclera and conjunctiva clear; pupils round and reactive to light; extraocular movements intact  Ears: External normal; canals clear; tympanic membranes normal  Oropharynx: Pink, supple. No suspicious lesions  Neck: Supple without thyromegaly, adenopathy  Lungs: Respirations unlabored; clear to auscultation bilaterally without wheeze, rales, rhonchi  CVS exam: normal rate and regular rhythm.  Abdomen: Soft; tender to palpation over LLQ; nondistended; normoactive bowel sounds; no masses or hepatosplenomegaly  Musculoskeletal: No deformities; no active joint inflammation  Extremities: No edema, cyanosis, clubbing  Vessels: Symmetric bilaterally  Neurologic: Alert and oriented; speech intact; face symmetrical; moves all extremities well; CNII-XII intact without focal deficit   Assessment:  1. LLQ pain     Plan:  Symptoms x 5 days and worsening; ? Ovarian cyst or diverticulitis; will check STAT abd/ pelvic CT tomorrow if possible; follow up to be determined;  This visit occurred during the SARS-CoV-2 public health emergency.  Safety protocols were in place, including screening questions prior to the visit, additional usage of staff PPE, and extensive cleaning of exam room while observing appropriate contact time as indicated for disinfecting solutions.     No follow-ups on file.  No orders of the defined types were placed in this  encounter.   Requested Prescriptions    No prescriptions requested or ordered in this encounter

## 2020-12-10 ENCOUNTER — Ambulatory Visit: Payer: Managed Care, Other (non HMO) | Admitting: Family

## 2020-12-13 DIAGNOSIS — Z8742 Personal history of other diseases of the female genital tract: Secondary | ICD-10-CM

## 2020-12-13 HISTORY — DX: Personal history of other diseases of the female genital tract: Z87.42

## 2020-12-17 ENCOUNTER — Other Ambulatory Visit: Payer: Self-pay | Admitting: Family

## 2020-12-17 ENCOUNTER — Other Ambulatory Visit: Payer: Self-pay

## 2020-12-17 ENCOUNTER — Ambulatory Visit
Admission: RE | Admit: 2020-12-17 | Discharge: 2020-12-17 | Disposition: A | Discharge status: Home / Self Care | Payer: Managed Care, Other (non HMO) | Source: Ambulatory Visit | Attending: Family | Admitting: Family

## 2020-12-17 DIAGNOSIS — R1032 Left lower quadrant pain: Secondary | ICD-10-CM

## 2020-12-17 MED ORDER — IOPAMIDOL (ISOVUE-300) INJECTION 61%
100.0000 mL | Freq: Once | INTRAVENOUS | Status: AC | PRN
  Administered 2020-12-17: 100 mL via INTRAVENOUS

## 2021-01-01 ENCOUNTER — Telehealth: Payer: Self-pay | Admitting: Family

## 2021-01-01 ENCOUNTER — Other Ambulatory Visit: Payer: Self-pay | Admitting: Family

## 2021-01-01 DIAGNOSIS — E039 Hypothyroidism, unspecified: Secondary | ICD-10-CM

## 2021-01-01 NOTE — Telephone Encounter (Signed)
I have called the pt to relay provider message below. There was no answer; I left a message to call back.

## 2021-01-01 NOTE — Telephone Encounter (Signed)
It is time to re-check her thyroid level. Please call to remind her of this and find out where she wants to get her labs done.

## 2021-01-02 ENCOUNTER — Other Ambulatory Visit: Payer: Self-pay

## 2021-01-02 ENCOUNTER — Other Ambulatory Visit (INDEPENDENT_AMBULATORY_CARE_PROVIDER_SITE_OTHER): Payer: Managed Care, Other (non HMO)

## 2021-01-02 DIAGNOSIS — E039 Hypothyroidism, unspecified: Secondary | ICD-10-CM

## 2021-01-02 LAB — TSH: TSH: 0.24 u[IU]/mL — ABNORMAL LOW (ref 0.35–4.50)

## 2021-01-03 NOTE — Telephone Encounter (Signed)
I have called pt to relay the message but it seems like the labs have been completed. I did leave VM asking if she is going to stay at [facility] or follow Vernona Rieger to [facility].

## 2021-01-06 ENCOUNTER — Other Ambulatory Visit (HOSPITAL_COMMUNITY)
Admission: RE | Admit: 2021-01-06 | Discharge: 2021-01-06 | Disposition: A | Discharge status: Home / Self Care | Payer: Managed Care, Other (non HMO) | Source: Ambulatory Visit | Attending: Obstetrics & Gynecology | Admitting: Obstetrics & Gynecology

## 2021-01-06 ENCOUNTER — Encounter: Payer: Self-pay | Admitting: Obstetrics & Gynecology

## 2021-01-06 ENCOUNTER — Other Ambulatory Visit: Payer: Self-pay | Admitting: Family

## 2021-01-06 ENCOUNTER — Ambulatory Visit: Payer: Managed Care, Other (non HMO) | Admitting: Obstetrics & Gynecology

## 2021-01-06 ENCOUNTER — Other Ambulatory Visit: Payer: Self-pay

## 2021-01-06 VITALS — BP 126/86 | Ht 65.0 in | Wt 176.0 lb

## 2021-01-06 DIAGNOSIS — R87610 Atypical squamous cells of undetermined significance on cytologic smear of cervix (ASC-US): Secondary | ICD-10-CM | POA: Diagnosis present

## 2021-01-06 DIAGNOSIS — N870 Mild cervical dysplasia: Secondary | ICD-10-CM | POA: Diagnosis not present

## 2021-01-06 DIAGNOSIS — R87810 Cervical high risk human papillomavirus (HPV) DNA test positive: Secondary | ICD-10-CM

## 2021-01-06 MED ORDER — LEVOTHYROXINE SODIUM 88 MCG PO TABS: 88.0000 ug | ORAL_TABLET | Freq: Every day | ORAL | 0 refills | Status: DC

## 2021-01-06 NOTE — Progress Notes (Signed)
    Susan Schmitt 1975-11-15 [id number]        45 y.o.  [id number] Boyfriend (distance relationship)  RP: ASCUS/HPV HR pos for Colposcopy  HPI: Pap 11/08/20 ASCUS/HPV HR pos.  Had a Negative Pap in 10/2018 with HPV HR pos.  Full STI screen otherwise Negative 10/2020.   OB History  Gravida Para Term Preterm AB Living  5 2     2 3   SAB IAB Ectopic Multiple Live Births               # Outcome Date GA Lbr Len/2nd Weight Sex Delivery Anes PTL Lv  5 Gravida           4 AB           3 AB           2 Para           1 Para             Past medical history,surgical history, problem list, medications, allergies, family history and social history were all reviewed and documented in the EPIC chart.   Directed ROS with pertinent positives and negatives documented in the history of present illness/assessment and plan.  Exam:  Vitals:   01/06/21 1358  BP: 126/86  Weight: 176 lb (79.8 kg)  Height: 5\' 5"  (1.651 m)   General appearance:  Normal  Colposcopy Procedure Note Susan Schmitt 01/06/2021  Indications:  ASCUS/HPV HR positive for Colpo  Procedure Details  The risks and benefits of the procedure and Verbal informed consent obtained.  Speculum placed in vagina and excellent visualization of cervix achieved, cervix swabbed x 3 with acetic acid solution.  Findings:  Cervix colposcopy:  Vaginal colposcopy:  Vulvar colposcopy:  Perirectal colposcopy:  The cervix was sprayed with Hurricane before performing the cervical biopsies.  Specimens: HPV 16-18-45.  Cervical Bxs at 10 and 2 O'Clock.  Complications:  None, good hemostasis with Silver Nitrate. . Plan:  Management per results.   Assessment/Plan:  45 y.o. [id number]   1. 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  Colposcopy procedure explained to patient.  Colposcopy findings reviewed with patient.  Postprocedure precautions discussed.  Management per results. - Cytology - PAP(  [facility]) - Surgical pathology( [facility]/ [facility])  59 MD, 2:13 PM 01/06/2021

## 2021-01-08 LAB — SURGICAL PATHOLOGY

## 2021-01-13 LAB — CYTOLOGY - PAP
Comment: NEGATIVE
Comment: NEGATIVE
HPV 16: NEGATIVE
HPV 18 / 45: NEGATIVE
High risk HPV: POSITIVE — AB

## 2021-02-18 ENCOUNTER — Other Ambulatory Visit (INDEPENDENT_AMBULATORY_CARE_PROVIDER_SITE_OTHER): Payer: Managed Care, Other (non HMO)

## 2021-02-18 ENCOUNTER — Other Ambulatory Visit: Payer: Self-pay | Admitting: Family

## 2021-02-18 ENCOUNTER — Other Ambulatory Visit: Payer: Self-pay

## 2021-02-18 DIAGNOSIS — E039 Hypothyroidism, unspecified: Secondary | ICD-10-CM | POA: Diagnosis not present

## 2021-02-18 LAB — TSH: TSH: 1.99 u[IU]/mL (ref 0.35–4.50)

## 2021-02-18 MED ORDER — LEVOTHYROXINE SODIUM 88 MCG PO TABS: 88.0000 ug | ORAL_TABLET | Freq: Every day | ORAL | 1 refills | Status: DC

## 2021-07-16 ENCOUNTER — Other Ambulatory Visit: Payer: Self-pay

## 2021-07-16 ENCOUNTER — Ambulatory Visit
Admission: EM | Admit: 2021-07-16 | Discharge: 2021-07-16 | Disposition: A | Discharge status: Home / Self Care | Payer: Managed Care, Other (non HMO)

## 2021-07-16 ENCOUNTER — Ambulatory Visit
Admission: EM | Admit: 2021-07-16 | Discharge: 2021-07-16 | Disposition: A | Discharge status: Home / Self Care | Payer: Managed Care, Other (non HMO) | Attending: Emergency Medicine | Admitting: Emergency Medicine

## 2021-07-16 DIAGNOSIS — N760 Acute vaginitis: Secondary | ICD-10-CM | POA: Diagnosis present

## 2021-07-16 DIAGNOSIS — N390 Urinary tract infection, site not specified: Secondary | ICD-10-CM | POA: Diagnosis present

## 2021-07-16 DIAGNOSIS — R350 Frequency of micturition: Secondary | ICD-10-CM | POA: Insufficient documentation

## 2021-07-16 LAB — POCT URINALYSIS DIP (MANUAL ENTRY)
Bilirubin, UA: NEGATIVE
Glucose, UA: NEGATIVE mg/dL
Ketones, POC UA: NEGATIVE mg/dL
Nitrite, UA: NEGATIVE
Protein Ur, POC: 100 mg/dL — AB
Spec Grav, UA: 1.025 (ref 1.010–1.025)
Urobilinogen, UA: 0.2 E.U./dL
pH, UA: 8 (ref 5.0–8.0)

## 2021-07-16 MED ORDER — SULFAMETHOXAZOLE-TRIMETHOPRIM 800-160 MG PO TABS: 1.0000 | ORAL_TABLET | Freq: Two times a day (BID) | ORAL | 0 refills | Status: AC

## 2021-07-16 MED ORDER — PHENAZOPYRIDINE HCL 200 MG PO TABS: 200.0000 mg | ORAL_TABLET | Freq: Three times a day (TID) | ORAL | 0 refills | Status: AC | PRN

## 2021-07-16 NOTE — ED Triage Notes (Signed)
Pt reports urinary frequency, lower abd pain and vaginal discomfort. Started: last night

## 2021-07-16 NOTE — ED Provider Notes (Signed)
[facility]    CSN: [id number] Arrival date & time: 07/16/21  1029      History   Chief Complaint Chief Complaint  Patient presents with   Urinary Frequency   Abdominal Pain    HPI Susan Schmitt is a 45 y.o. female.   Patient reports acute onset of urinary frequency, lower abdominal pain, burning with urination, sensation of incomplete emptying, states symptoms started last night mildly and became acute this morning.  Patient reports she is in a lot of discomfort at this point.  Patient denies fever, aches, chills, nausea, vomiting, diarrhea, history of constipation, history of recurrent urinary tract infections, abnormal vaginal discharge.  The history is provided by the patient.   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 Authorizing Provider  phenazopyridine (PYRIDIUM) 200 MG tablet Take 1 tablet (200 mg total) by mouth 3 (three) times daily as needed for up to 2 days for pain. 07/16/21 07/18/21 Yes Theadora Rama Scales, PA-C  sulfamethoxazole-trimethoprim (BACTRIM DS) 800-160 MG tablet Take 1 tablet by mouth 2 (two) times daily for 7 days. 07/16/21 07/23/21 Yes Theadora Rama Scales, PA-C  fluticasone (FLONASE) 50 MCG/ACT  nasal spray Place 2 sprays into both nostrils daily. 11/08/20   Olive Bass, FNP  levothyroxine (SYNTHROID) 88 MCG tablet Take 1 tablet (88 mcg total) by mouth daily before breakfast. 02/18/21   Olive Bass, FNP  Multiple Vitamin (MULTIVITAMIN WITH MINERALS) TABS tablet Take 1 tablet by mouth daily.    [provider]    Family History Family History  Problem Relation Age of Onset   COPD Mother    Depression Father    Breast cancer Paternal Grandmother    Cancer Paternal Grandmother        stomach   Cancer Brother    Non-Hodgkin's lymphoma Brother    Cancer Maternal Grandfather        prostate    Social History Social History   Tobacco Use   Smoking status: Former    Types: Cigarettes    Quit date: 01/06/2009    Years since quitting: 12.5   Smokeless tobacco: Never  Vaping Use   Vaping Use: Never used  Substance Use Topics   Alcohol use: Yes    Comment: social   Drug use: No     Allergies   Patient has no known allergies.  Review of Systems Review of Systems Pertinent findings noted in history of present illness.    Physical Exam Triage Vital Signs ED Triage Vitals  Enc Vitals Group     BP 07/11/21 0827 (!) 147/82     Pulse Rate 07/11/21 0827 72     Resp 07/11/21 0827 18     Temp 07/11/21 0827 98.3 F (36.8 C)     Temp Source 07/11/21 0827 Oral     SpO2 07/11/21 0827 98 %     Weight --      Height --      Head Circumference --      Peak Flow --      Pain Score 07/11/21 0826 5     Pain Loc --      Pain Edu? --      Excl. in GC? --    No data found.  Updated Vital Signs BP 123/90 (BP Location: Right Arm)   Pulse 70   Temp 98.7 F (37.1 C) (Oral)   Resp 18   LMP 10/07/2020   SpO2 99%   Visual Acuity Right Eye Distance:   Left Eye Distance:   Bilateral Distance:    Right Eye Near:   Left Eye Near:    Bilateral Near:     Physical Exam Vitals and nursing note reviewed.  Constitutional:      General: She is  not in acute distress.    Appearance: Normal appearance. She is not ill-appearing.  HENT:     Head: Normocephalic and atraumatic.  Eyes:     General: Lids are normal.        Right eye: No discharge.        Left eye: No discharge.     Extraocular Movements: Extraocular movements intact.     Conjunctiva/sclera: Conjunctivae normal.     Right eye: Right conjunctiva is not injected.     Left eye: Left conjunctiva is not injected.  Neck:     Trachea: Trachea and phonation normal.  Cardiovascular:     Rate and Rhythm: Normal rate and regular rhythm.     Pulses: Normal pulses.     Heart sounds: Normal heart sounds. No murmur heard.   No friction rub. No gallop.  Pulmonary:     Effort: Pulmonary effort is normal. No accessory muscle usage, prolonged expiration or respiratory distress.     Breath sounds: Normal breath sounds. No stridor, decreased air movement or transmitted upper airway sounds. No decreased breath sounds, wheezing, rhonchi or rales.  Chest:     Chest wall: No tenderness.  Abdominal:     General: Abdomen is flat. Bowel sounds are normal. There is no distension.     Palpations: Abdomen is soft.     Tenderness: There is abdominal tenderness in the suprapubic area. There is no right CVA tenderness or left CVA tenderness.     Hernia: No hernia is present.  Musculoskeletal:        General: Normal range of motion.     Cervical back: Normal range of motion and neck supple. Normal range of motion.  Lymphadenopathy:     Cervical: No cervical adenopathy.  Skin:    General: Skin is warm and dry.     Findings: No erythema or rash.  Neurological:     General: No focal deficit present.     Mental Status: She is alert and oriented to person, place, and time.  Psychiatric:        Mood and  Affect: Mood normal.        Behavior: Behavior normal.     UC Treatments / Results  Labs (all labs ordered are listed, but only abnormal results are displayed) Labs Reviewed  POCT  URINALYSIS DIP (MANUAL ENTRY) - Abnormal; Notable for the following components:      Result Value   Clarity, UA cloudy (*)    Blood, UA large (*)    Protein Ur, POC =100 (*)    Leukocytes, UA Moderate (2+) (*)    All other components within normal limits  URINE CULTURE  CERVICOVAGINAL ANCILLARY ONLY    EKG   Radiology No results found.  Procedures Procedures (including critical care time)  Medications Ordered in UC Medications - No data to display  Initial Impression / Assessment and Plan / UC Course  I have reviewed the triage vital signs and the nursing notes.  Pertinent labs & imaging results that were available during my care of the patient were reviewed by me and considered in my medical decision making (see chart for details).     Urine dip today was concerning for acute urinary tract infection.  Patient was provided with empiric treatment with Bactrim and symptomatic treatment with Pyridium.  Patient advised she will be notified of the results of urine culture once received.  Return precautions provided.  Patient verbalized understanding and agreement of plan as discussed.  All questions were addressed during visit.  Please see discharge instructions below for further details of plan.  Final Clinical Impressions(s) / UC Diagnoses   Final diagnoses:  Urinary frequency  Acute urinary tract infection  Vaginitis and vulvovaginitis     Discharge Instructions      For your acute urinary tract infection, we will be sending your urine out for culture to identify the bacterial organism causing the infection.  If adjustments need to be made to your antibiotics, new prescriptions will be sent.  I have given you a prescription for Bactrim, please take 1 tablet daily for the next 7 days, if you have significant relief of your symptoms after 5 days, you can discontinue.  I also prescribed you a medication called Pyridium that relieves the discomfort from urinary tract  infections, you can take 1 tablet up to 3 times daily as needed, not to exceed 2 days of treatment.  Please return for repeat evaluation if you do not have complete relief of your symptoms after 7 days.     ED Prescriptions     Medication Sig Dispense Auth. Provider   sulfamethoxazole-trimethoprim (BACTRIM DS) 800-160 MG tablet Take 1 tablet by mouth 2 (two) times daily for 7 days. 14 tablet Theadora Rama Scales, PA-C   phenazopyridine (PYRIDIUM) 200 MG tablet Take 1 tablet (200 mg total) by mouth 3 (three) times daily as needed for up to 2 days for pain. 6 tablet Theadora Rama Scales, PA-C      PDMP not reviewed this encounter.    Theadora Rama Scales, PA-C 07/16/21 1744

## 2021-07-16 NOTE — Discharge Instructions (Addendum)
For your acute urinary tract infection, we will be sending your urine out for culture to identify the bacterial organism causing the infection.  If adjustments need to be made to your antibiotics, new prescriptions will be sent.  I have given you a prescription for Bactrim, please take 1 tablet daily for the next 7 days, if you have significant relief of your symptoms after 5 days, you can discontinue.  I also prescribed you a medication called Pyridium that relieves the discomfort from urinary tract infections, you can take 1 tablet up to 3 times daily as needed, not to exceed 2 days of treatment.  Please return for repeat evaluation if you do not have complete relief of your symptoms after 7 days.

## 2021-07-17 ENCOUNTER — Telehealth (HOSPITAL_COMMUNITY): Payer: Self-pay | Admitting: Emergency Medicine

## 2021-07-17 LAB — CERVICOVAGINAL ANCILLARY ONLY
Bacterial Vaginitis (gardnerella): POSITIVE — AB
Candida Glabrata: NEGATIVE
Candida Vaginitis: NEGATIVE
Chlamydia: NEGATIVE
Comment: NEGATIVE
Comment: NEGATIVE
Comment: NEGATIVE
Comment: NEGATIVE
Comment: NEGATIVE
Comment: NORMAL
Neisseria Gonorrhea: NEGATIVE
Trichomonas: NEGATIVE

## 2021-07-17 MED ORDER — METRONIDAZOLE 500 MG PO TABS
500.0000 mg | ORAL_TABLET | Freq: Two times a day (BID) | ORAL | 0 refills | Status: DC
Start: 2021-07-17 — End: 2022-01-22

## 2021-07-18 ENCOUNTER — Ambulatory Visit: Payer: Managed Care, Other (non HMO) | Admitting: Family

## 2021-07-18 LAB — URINE CULTURE: Culture: 90000 — AB

## 2021-09-30 ENCOUNTER — Other Ambulatory Visit (HOSPITAL_BASED_OUTPATIENT_CLINIC_OR_DEPARTMENT_OTHER): Payer: Self-pay | Admitting: Family

## 2021-09-30 DIAGNOSIS — Z1231 Encounter for screening mammogram for malignant neoplasm of breast: Secondary | ICD-10-CM

## 2021-11-17 ENCOUNTER — Other Ambulatory Visit: Payer: Self-pay

## 2021-11-17 ENCOUNTER — Ambulatory Visit (HOSPITAL_BASED_OUTPATIENT_CLINIC_OR_DEPARTMENT_OTHER)
Admission: RE | Admit: 2021-11-17 | Discharge: 2021-11-17 | Disposition: A | Discharge status: Home / Self Care | Payer: Managed Care, Other (non HMO) | Source: Ambulatory Visit | Attending: Family | Admitting: Family

## 2021-11-17 ENCOUNTER — Encounter (HOSPITAL_BASED_OUTPATIENT_CLINIC_OR_DEPARTMENT_OTHER): Payer: Self-pay | Admitting: Radiology

## 2021-11-17 DIAGNOSIS — Z1231 Encounter for screening mammogram for malignant neoplasm of breast: Secondary | ICD-10-CM

## 2022-01-20 ENCOUNTER — Telehealth: Payer: Self-pay | Admitting: Family

## 2022-01-20 ENCOUNTER — Other Ambulatory Visit: Payer: Self-pay | Admitting: Family

## 2022-01-20 NOTE — Telephone Encounter (Signed)
Appointment coming Thursday and she will be fasting.  ?

## 2022-01-20 NOTE — Telephone Encounter (Signed)
Due for OV and labs; has not been seen since March 2022; not sure if she was transferring here but can also let her know 2 very good providers at [facility] taking new patients.  ?

## 2022-01-22 ENCOUNTER — Encounter: Payer: Self-pay | Admitting: Family

## 2022-01-22 ENCOUNTER — Ambulatory Visit: Payer: Managed Care, Other (non HMO) | Admitting: Family

## 2022-01-22 VITALS — BP 112/82 | HR 64 | Temp 98.0°F | Ht 65.0 in | Wt 177.0 lb

## 2022-01-22 DIAGNOSIS — E039 Hypothyroidism, unspecified: Secondary | ICD-10-CM

## 2022-01-22 DIAGNOSIS — Z13220 Encounter for screening for lipoid disorders: Secondary | ICD-10-CM

## 2022-01-22 DIAGNOSIS — Z1211 Encounter for screening for malignant neoplasm of colon: Secondary | ICD-10-CM

## 2022-01-22 NOTE — Progress Notes (Signed)
?Susan Schmitt is a 46 y.o. female with the following history as recorded in EpicCare:  ?Patient Active Problem List  ? Diagnosis Date Noted  ? Sacroiliac joint pain 01/12/2018  ? Lumbar pain 10/15/2017  ? Prolapsed lumbar disc 10/15/2017  ? Patellofemoral stress syndrome 09/17/2017  ? Genital herpes simplex 02/27/2016  ? History of female sterilization 01/30/2016  ? Hypothyroidism 01/30/2016  ?  ?Current Outpatient Medications  ?Medication Sig Dispense Refill  ? levothyroxine (SYNTHROID) 88 MCG tablet Take 1 tablet (88 mcg total) by mouth daily before breakfast. OV required for further refills 90 tablet 0  ? Multiple Vitamin (MULTIVITAMIN WITH MINERALS) TABS tablet Take 1 tablet by mouth daily.    ? ?No current facility-administered medications for this visit.  ?  ?Allergies: Patient has no known allergies.  ?Past Medical History:  ?Diagnosis Date  ? Hypothyroid   ? Radiation   ? thyroid  ?  ?Past Surgical History:  ?Procedure Laterality Date  ? FOOT SURGERY Right   ? THUMB ARTHROSCOPY    ? TUBAL LIGATION    ?  ?Family History  ?Problem Relation Age of Onset  ? COPD Mother   ? Depression Father   ? Breast cancer Paternal Grandmother   ? Cancer Paternal Grandmother   ?     stomach  ? Cancer Brother   ? Non-Hodgkin's lymphoma Brother   ? Cancer Maternal Grandfather   ?     prostate  ?  ?Social History  ? ?Tobacco Use  ? Smoking status: Former  ?  Types: Cigarettes  ?  Quit date: 01/06/2009  ?  Years since quitting: 13.0  ? Smokeless tobacco: Never  ?Substance Use Topics  ? Alcohol use: Yes  ?  Comment: social  ?  ?Subjective:  ?Presents today to follow up on hypothyroidism; per patient, she has been off her medication for a week; refill was sent earlier this week but miscommunication on patient understanding that it was available;  ? ?Would also like to discuss updating baseline colonoscopy;  ? ? ? ?Objective:  ?Vitals:  ? 01/22/22 0921  ?BP: 112/82  ?Pulse: 64  ?Temp: 98 ?F (36.7 ?C)  ?SpO2: 99%  ?Weight: 177 lb  (80.3 kg)  ?Height: 5' 5"  (1.651 m)  ?  ?General: Well developed, well nourished, in no acute distress  ?Skin : Warm and dry.  ?Head: Normocephalic and atraumatic  ?Eyes: Sclera and conjunctiva clear; pupils round and reactive to light; extraocular movements intact  ?Neck: Supple without thyromegaly, adenopathy  ?Lungs: Respirations unlabored; clear to auscultation bilaterally without wheeze, rales, rhonchi  ?CVS exam: normal rate and regular rhythm.  ?Neurologic: Alert and oriented; speech intact; face symmetrical; moves all extremities well; CNII-XII intact without focal deficit  ? ?Assessment:  ?1. Hypothyroidism, unspecified type   ?2. Lipid screening   ?3. Colon cancer screening   ?  ?Plan:  ?Re-start Synthroid; verified that medication is available at pharmacy; plan to re-check labs in 1 month;  ?Check lipid panel at upcoming labs; ?Order for baseline colonoscopy; ? ?Contact information given for her GYN- she is overdue for her follow up pap smear and agrees to call and schedule.  ? ?No follow-ups on file.  ?Orders Placed This Encounter  ?Procedures  ? CBC with Differential/Platelet  ?  Standing Status:   Future  ?  Standing Expiration Date:   01/23/2023  ? Comp Met (CMET)  ?  Standing Status:   Future  ?  Standing Expiration Date:   01/23/2023  ?  Lipid panel  ?  Standing Status:   Future  ?  Standing Expiration Date:   01/23/2023  ? TSH  ?  Standing Status:   Future  ?  Standing Expiration Date:   01/23/2023  ? Ambulatory referral to Gastroenterology  ?  Referral Priority:   Routine  ?  Referral Type:   Consultation  ?  Referral Reason:   Specialty Services Required  ?  Number of Visits Requested:   1  ?  ?Requested Prescriptions  ? ? No prescriptions requested or ordered in this encounter  ?  ? ?

## 2022-01-22 NOTE — Patient Instructions (Signed)
Please call your GYN to get re-scheduled for your pap smear; it was due in November 2022;  ?

## 2022-01-28 ENCOUNTER — Encounter: Payer: Self-pay | Admitting: Gastroenterology

## 2022-02-23 ENCOUNTER — Other Ambulatory Visit (INDEPENDENT_AMBULATORY_CARE_PROVIDER_SITE_OTHER): Payer: Managed Care, Other (non HMO)

## 2022-02-23 DIAGNOSIS — E039 Hypothyroidism, unspecified: Secondary | ICD-10-CM | POA: Diagnosis not present

## 2022-02-23 DIAGNOSIS — Z13220 Encounter for screening for lipoid disorders: Secondary | ICD-10-CM

## 2022-02-23 LAB — CBC WITH DIFFERENTIAL/PLATELET
Basophils Absolute: 0 10*3/uL (ref 0.0–0.1)
Basophils Relative: 0.7 % (ref 0.0–3.0)
Eosinophils Absolute: 0.3 10*3/uL (ref 0.0–0.7)
Eosinophils Relative: 4.5 % (ref 0.0–5.0)
HCT: 37.1 % (ref 36.0–46.0)
Hemoglobin: 12.5 g/dL (ref 12.0–15.0)
Lymphocytes Relative: 36.3 % (ref 12.0–46.0)
Lymphs Abs: 2.2 10*3/uL (ref 0.7–4.0)
MCHC: 33.6 g/dL (ref 30.0–36.0)
MCV: 96.6 fl (ref 78.0–100.0)
Monocytes Absolute: 0.5 10*3/uL (ref 0.1–1.0)
Monocytes Relative: 8.2 % (ref 3.0–12.0)
Neutro Abs: 3.1 10*3/uL (ref 1.4–7.7)
Neutrophils Relative %: 50.3 % (ref 43.0–77.0)
Platelets: 186 10*3/uL (ref 150.0–400.0)
RBC: 3.84 Mil/uL — ABNORMAL LOW (ref 3.87–5.11)
RDW: 11.9 % (ref 11.5–15.5)
WBC: 6.2 10*3/uL (ref 4.0–10.5)

## 2022-02-23 LAB — LIPID PANEL
Cholesterol: 172 mg/dL (ref 0–200)
HDL: 55.8 mg/dL (ref >39.00)
LDL Cholesterol: 98 mg/dL (ref 0–99)
NonHDL: 115.87
Total CHOL/HDL Ratio: 3
Triglycerides: 88 mg/dL (ref 0.0–149.0)
VLDL: 17.6 mg/dL (ref 0.0–40.0)

## 2022-02-23 LAB — COMPREHENSIVE METABOLIC PANEL
ALT: 9 U/L (ref 0–35)
AST: 11 U/L (ref 0–37)
Albumin: 4.1 g/dL (ref 3.5–5.2)
Alkaline Phosphatase: 61 U/L (ref 39–117)
BUN: 11 mg/dL (ref 6–23)
CO2: 28 mEq/L (ref 19–32)
Calcium: 9 mg/dL (ref 8.4–10.5)
Chloride: 106 mEq/L (ref 96–112)
Creatinine, Ser: 0.72 mg/dL (ref 0.40–1.20)
GFR: 100.76 mL/min (ref >60.00)
Glucose, Bld: 87 mg/dL (ref 70–99)
Potassium: 4 mEq/L (ref 3.5–5.1)
Sodium: 142 mEq/L (ref 135–145)
Total Bilirubin: 0.6 mg/dL (ref 0.2–1.2)
Total Protein: 6.6 g/dL (ref 6.0–8.3)

## 2022-02-23 LAB — TSH: TSH: 1.59 u[IU]/mL (ref 0.35–5.50)

## 2022-02-25 ENCOUNTER — Ambulatory Visit (AMBULATORY_SURGERY_CENTER): Payer: Self-pay | Admitting: *Deleted

## 2022-02-25 VITALS — Ht 65.0 in | Wt 174.0 lb

## 2022-02-25 DIAGNOSIS — Z1211 Encounter for screening for malignant neoplasm of colon: Secondary | ICD-10-CM

## 2022-02-25 MED ORDER — NA SULFATE-K SULFATE-MG SULF 17.5-3.13-1.6 GM/177ML PO SOLN: 1.0000 | Freq: Once | ORAL | 0 refills | Status: AC

## 2022-02-25 NOTE — Progress Notes (Signed)
No egg or soy allergy known to patient  No issues known to pt with past sedation with any surgeries or procedures Patient denies ever being told they had issues or difficulty with intubation  No FH of Malignant Hyperthermia Pt is not on diet pills Pt is not on  home 02  Pt is not on blood thinners  Pt denies issues with constipation  No A fib or A flutter    NO PA's for preps discussed with pt In PV today  Discussed with pt there will be an out-of-pocket cost for prep and that varies from $0 to 70 +  dollars - pt verbalized understanding  Pt instructed to use [email or web address] or GoodRx for a price reduction on prep  

## 2022-03-14 DIAGNOSIS — K5730 Diverticulosis of large intestine without perforation or abscess without bleeding: Secondary | ICD-10-CM

## 2022-03-14 HISTORY — DX: Diverticulosis of large intestine without perforation or abscess without bleeding: K57.30

## 2022-03-18 ENCOUNTER — Encounter: Payer: Self-pay | Admitting: Gastroenterology

## 2022-03-25 ENCOUNTER — Ambulatory Visit (AMBULATORY_SURGERY_CENTER): Payer: Managed Care, Other (non HMO) | Admitting: Gastroenterology

## 2022-03-25 ENCOUNTER — Encounter: Payer: Self-pay | Admitting: Gastroenterology

## 2022-03-25 VITALS — BP 102/66 | HR 57 | Temp 97.3°F | Resp 16 | Ht 65.0 in | Wt 174.0 lb

## 2022-03-25 DIAGNOSIS — Z1211 Encounter for screening for malignant neoplasm of colon: Secondary | ICD-10-CM | POA: Diagnosis present

## 2022-03-25 HISTORY — PX: COLONOSCOPY WITH PROPOFOL: SHX5780

## 2022-03-25 MED ORDER — SODIUM CHLORIDE 0.9 % IV SOLN: 500.0000 mL | Freq: Once | INTRAVENOUS | Status: DC

## 2022-03-25 NOTE — Op Note (Signed)
[facility] Patient Name: Susan Schmitt Procedure Date: 03/25/2022 9:07 AM MRN: [id number] Endoscopist: Sherilyn Cooter L. Myrtie Neither , MD Age: 46 Referring MD:  Date of Birth: 05/26/1976 Gender: Female Account #: [id number] Procedure:                Colonoscopy Indications:              Screening for colorectal malignant neoplasm, This                            is the patient's first colonoscopy Medicines:                Monitored Anesthesia Care Procedure:                Pre-Anesthesia Assessment:                           - Prior to the procedure, a History and Physical                            was performed, and patient medications and                            allergies were reviewed. The patient's tolerance of                            previous anesthesia was also reviewed. The risks                            and benefits of the procedure and the sedation                            options and risks were discussed with the patient.                            All questions were answered, and informed consent                            was obtained. Prior Anticoagulants: The patient has                            taken no previous anticoagulant or antiplatelet                            agents. ASA Grade Assessment: II - A patient with                            mild systemic disease. After reviewing the risks                            and benefits, the patient was deemed in                            satisfactory condition to undergo the procedure.  After obtaining informed consent, the colonoscope                            was passed under direct vision. Throughout the                            procedure, the patient's blood pressure, pulse, and                            oxygen saturations were monitored continuously. The                            Olympus CF-HQ190L [id number]) Colonoscope was                            introduced through the anus  and advanced to the the                            cecum, identified by appendiceal orifice and                            ileocecal valve. The colonoscopy was performed                            without difficulty. The patient tolerated the                            procedure well. The quality of the bowel                            preparation was excellent. The ileocecal valve,                            appendiceal orifice, and rectum were photographed. Scope In: 9:12:28 AM Scope Out: 9:23:08 AM Scope Withdrawal Time: 0 hours 7 minutes 42 seconds  Total Procedure Duration: 0 hours 10 minutes 40 seconds  Findings:                 The perianal and digital rectal examinations were                            normal.                           Repeat examination of right colon under NBI                            performed.                           Multiple diverticula were found in the entire colon.                           There is no endoscopic evidence of polyps in the  entire colon.                           The exam was otherwise without abnormality on                            direct and retroflexion views. Complications:            No immediate complications. Estimated Blood Loss:     Estimated blood loss: none. Impression:               - Diverticulosis in the entire examined colon.                           - The examination was otherwise normal on direct                            and retroflexion views.                           - No specimens collected. Recommendation:           - Patient has a contact number available for                            emergencies. The signs and symptoms of potential                            delayed complications were discussed with the                            patient. Return to normal activities tomorrow.                            Written discharge instructions were provided to the                             patient.                           - Resume previous diet.                           - Continue present medications.                           - Repeat colonoscopy in 10 years for screening                            purposes. Landan Fedie L. Myrtie Neither, MD 03/25/2022 9:26:17 AM This report has been signed electronically.

## 2022-03-25 NOTE — Progress Notes (Signed)
History and Physical:  This patient presents for endoscopic testing for: Encounter Diagnosis  Name Primary?   Special screening for malignant neoplasms, colon Yes    Average risk for CRC - first screening colonoscopy. Patient denies chronic abdominal pain, rectal bleeding, constipation or diarrhea.   Patient is otherwise without complaints or active issues today.   Past Medical History: Past Medical History:  Diagnosis Date   Allergy    Hypothyroid    Radiation    thyroid     Past Surgical History: Past Surgical History:  Procedure Laterality Date   FOOT SURGERY Right    THUMB ARTHROSCOPY     TUBAL LIGATION      Allergies: No Known Allergies  Outpatient Meds: Current Outpatient Medications  Medication Sig Dispense Refill   levothyroxine (SYNTHROID) 88 MCG tablet Take 1 tablet (88 mcg total) by mouth daily before breakfast. OV required for further refills 90 tablet 0   Multiple Vitamin (MULTIVITAMIN WITH MINERALS) TABS tablet Take 1 tablet by mouth daily.     Current Facility-Administered Medications  Medication Dose Route Frequency Provider Last Rate Last Admin   0.9 %  sodium chloride infusion  500 mL Intravenous Once Sherrilyn Rist, MD          ___________________________________________________________________ Objective   Exam:  BP 99/63   Pulse 63   Temp (!) 97.3 F (36.3 C) (Temporal)   Ht 5\' 5"  (1.651 m)   Wt 174 lb (78.9 kg)   LMP 01/10/2022   SpO2 99%   BMI 28.96 kg/m   CV: RRR without murmur, S1/S2 Resp: clear to auscultation bilaterally, normal RR and effort noted GI: soft, no tenderness, with active bowel sounds.   Assessment: Encounter Diagnosis  Name Primary?   Special screening for malignant neoplasms, colon Yes     Plan: Colonoscopy  The benefits and risks of the planned procedure were described in detail with the patient or (when appropriate) their health care proxy.  Risks were outlined as including, but not limited  to, bleeding, infection, perforation, adverse medication reaction leading to cardiac or pulmonary decompensation, pancreatitis (if ERCP).  The limitation of incomplete mucosal visualization was also discussed.  No guarantees or warranties were given.    The patient is appropriate for an endoscopic procedure in the ambulatory setting.   - 01/12/2022, MD

## 2022-03-25 NOTE — Progress Notes (Signed)
Pt non-responsive, VVS, Report to RN  °

## 2022-03-25 NOTE — Patient Instructions (Signed)
Handout provided on diverticulosis.   Repeat colonoscopy in 10 years for screening purposes.   YOU HAD AN ENDOSCOPIC PROCEDURE TODAY AT THE [facility]:   Refer to the procedure report that was given to you for any specific questions about what was found during the examination.  If the procedure report does not answer your questions, please call your gastroenterologist to clarify.  If you requested that your care partner not be given the details of your procedure findings, then the procedure report has been included in a sealed envelope for you to review at your convenience later.  YOU SHOULD EXPECT: Some feelings of bloating in the abdomen. Passage of more gas than usual.  Walking can help get rid of the air that was put into your GI tract during the procedure and reduce the bloating. If you had a lower endoscopy (such as a colonoscopy or flexible sigmoidoscopy) you may notice spotting of blood in your stool or on the toilet paper. If you underwent a bowel prep for your procedure, you may not have a normal bowel movement for a few days.  Please Note:  You might notice some irritation and congestion in your nose or some drainage.  This is from the oxygen used during your procedure.  There is no need for concern and it should clear up in a day or so.  SYMPTOMS TO REPORT IMMEDIATELY:  Following lower endoscopy (colonoscopy or flexible sigmoidoscopy):  Excessive amounts of blood in the stool  Significant tenderness or worsening of abdominal pains  Swelling of the abdomen that is new, acute  Fever of 100F or higher  For urgent or emergent issues, a gastroenterologist can be reached at any hour by calling [phone]. Do not use MyChart messaging for urgent concerns.    DIET:  We do recommend a small meal at first, but then you may proceed to your regular diet.  Drink plenty of fluids but you should avoid alcoholic beverages for 24 hours.  ACTIVITY:  You should plan to take it  easy for the rest of today and you should NOT DRIVE or use heavy machinery until tomorrow (because of the sedation medicines used during the test).    FOLLOW UP: Our staff will call the number listed on your records the next business day following your procedure.  We will call around 7:15- 8:00 am to check on you and address any questions or concerns that you may have regarding the information given to you following your procedure. If we do not reach you, we will leave a message.  If you develop any symptoms (ie: fever, flu-like symptoms, shortness of breath, cough etc.) before then, please call [phone].  If you test positive for Covid 19 in the 2 weeks post procedure, please call and report this information to [place].    If any biopsies were taken you will be contacted by phone or by letter within the next 1-3 weeks.  Please call us at [phone] if you have not heard about the biopsies in 3 weeks.    SIGNATURES/CONFIDENTIALITY: You and/or your care partner have signed paperwork which will be entered into your electronic medical record.  These signatures attest to the fact that that the information above on your After Visit Summary has been reviewed and is understood.  Full responsibility of the confidentiality of this discharge information lies with you and/or your care-partner.

## 2022-03-25 NOTE — Progress Notes (Signed)
VS completed by DT.  Pt's states no medical or surgical changes since previsit or office visit.  

## 2022-03-26 ENCOUNTER — Telehealth: Payer: Self-pay | Admitting: *Deleted

## 2022-03-26 NOTE — Telephone Encounter (Signed)
  Follow up Call-     03/25/2022    8:39 AM  Call back number  Post procedure Call Back phone  # [phone]  Permission to leave phone message Yes     Patient questions:  Do you have a fever, pain , or abdominal swelling? No. Pain Score  0 *  Have you tolerated food without any problems? Yes.    Have you been able to return to your normal activities? Yes.    Do you have any questions about your discharge instructions: Diet   No. Medications  No. Follow up visit  No.  Do you have questions or concerns about your Care? No.  Actions: * If pain score is 4 or above: No action needed, pain <4.

## 2022-03-26 NOTE — Telephone Encounter (Signed)
  Follow up Call-     03/25/2022    8:39 AM  Call back number  Post procedure Call Back phone  # [phone]  Permission to leave phone message Yes     Patient questions:  Do you have a fever, pain , or abdominal swelling? No. Pain Score  0 *  Have you tolerated food without any problems? Yes.    Have you been able to return to your normal activities? Yes.    Do you have any questions about your discharge instructions: Diet   No. Medications  No. Follow up visit  No.  Do you have questions or concerns about your Care? No.  Actions: * If pain score is 4 or above: No action needed, pain <4.   

## 2022-05-06 ENCOUNTER — Other Ambulatory Visit: Payer: Self-pay | Admitting: Family

## 2022-05-22 ENCOUNTER — Encounter: Payer: Self-pay | Admitting: Family

## 2022-05-22 ENCOUNTER — Ambulatory Visit: Payer: Managed Care, Other (non HMO) | Admitting: Family

## 2022-05-22 VITALS — BP 104/70 | HR 59 | Temp 98.2°F | Ht 65.0 in | Wt 163.6 lb

## 2022-05-22 DIAGNOSIS — R5383 Other fatigue: Secondary | ICD-10-CM

## 2022-05-22 DIAGNOSIS — Z23 Encounter for immunization: Secondary | ICD-10-CM | POA: Diagnosis not present

## 2022-05-22 DIAGNOSIS — R87619 Unspecified abnormal cytological findings in specimens from cervix uteri: Secondary | ICD-10-CM | POA: Diagnosis not present

## 2022-05-22 DIAGNOSIS — E039 Hypothyroidism, unspecified: Secondary | ICD-10-CM | POA: Diagnosis not present

## 2022-05-22 LAB — CBC WITH DIFFERENTIAL/PLATELET
Basophils Absolute: 0 10*3/uL (ref 0.0–0.1)
Basophils Relative: 0.9 % (ref 0.0–3.0)
Eosinophils Absolute: 0.2 10*3/uL (ref 0.0–0.7)
Eosinophils Relative: 3.6 % (ref 0.0–5.0)
HCT: 39.2 % (ref 36.0–46.0)
Hemoglobin: 13.1 g/dL (ref 12.0–15.0)
Lymphocytes Relative: 45.2 % (ref 12.0–46.0)
Lymphs Abs: 2.5 10*3/uL (ref 0.7–4.0)
MCHC: 33.4 g/dL (ref 30.0–36.0)
MCV: 97.2 fl (ref 78.0–100.0)
Monocytes Absolute: 0.4 10*3/uL (ref 0.1–1.0)
Monocytes Relative: 8 % (ref 3.0–12.0)
Neutro Abs: 2.3 10*3/uL (ref 1.4–7.7)
Neutrophils Relative %: 42.3 % — ABNORMAL LOW (ref 43.0–77.0)
Platelets: 186 10*3/uL (ref 150.0–400.0)
RBC: 4.04 Mil/uL (ref 3.87–5.11)
RDW: 12.3 % (ref 11.5–15.5)
WBC: 5.5 10*3/uL (ref 4.0–10.5)

## 2022-05-22 LAB — COMPREHENSIVE METABOLIC PANEL
ALT: 12 U/L (ref 0–35)
AST: 14 U/L (ref 0–37)
Albumin: 4.3 g/dL (ref 3.5–5.2)
Alkaline Phosphatase: 66 U/L (ref 39–117)
BUN: 13 mg/dL (ref 6–23)
CO2: 28 mEq/L (ref 19–32)
Calcium: 9.4 mg/dL (ref 8.4–10.5)
Chloride: 105 mEq/L (ref 96–112)
Creatinine, Ser: 0.69 mg/dL (ref 0.40–1.20)
GFR: 104.41 mL/min (ref >60.00)
Glucose, Bld: 81 mg/dL (ref 70–99)
Potassium: 4.2 mEq/L (ref 3.5–5.1)
Sodium: 141 mEq/L (ref 135–145)
Total Bilirubin: 0.7 mg/dL (ref 0.2–1.2)
Total Protein: 7.2 g/dL (ref 6.0–8.3)

## 2022-05-22 LAB — TSH: TSH: 1.27 u[IU]/mL (ref 0.35–5.50)

## 2022-05-22 LAB — VITAMIN B12: Vitamin B-12: 357 pg/mL (ref 211–911)

## 2022-05-22 MED ORDER — TRAZODONE HCL 50 MG PO TABS: 25.0000 mg | ORAL_TABLET | Freq: Every evening | ORAL | 3 refills | Status: DC | PRN

## 2022-05-22 NOTE — Progress Notes (Signed)
Susan Schmitt is a 46 y.o. 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     traZODone (DESYREL) 50 MG tablet Take 0.5-1 tablets (25-50 mg total) by mouth at bedtime as needed for sleep. 30 tablet 3   No current facility-administered medications for this visit.    Allergies: Patient has no known allergies.  Past Medical History:  Diagnosis Date   Allergy    Hypothyroid    Radiation    thyroid    Past Surgical History:  Procedure Laterality Date   FOOT SURGERY Right    THUMB ARTHROSCOPY     TUBAL LIGATION      Family History  Problem Relation Age of Onset   COPD Mother    Depression Father    Cancer Brother    Non-Hodgkin's lymphoma Brother    Cancer Maternal Grandfather        prostate   Stomach cancer Paternal Grandmother    Breast cancer Paternal Grandmother    Cancer Paternal Grandmother        stomach   Colon cancer Neg Hx    Colon polyps Neg Hx    Esophageal cancer Neg Hx    Rectal cancer Neg Hx     Social History   Tobacco Use   Smoking status: Former    Types: Cigarettes    Quit date: 01/06/2009    Years since quitting: 13.3   Smokeless tobacco: Never  Substance Use Topics   Alcohol use: Yes    Comment: social    Subjective:  Complaining of worsening fatigue x 2 month; is sleeping 6 hours per night; notes that sleep is good once she is able to fall asleep- hard time turning brain off at  night to sleep; "just don't feel like my usual self."  Has been having increased hot flashes;  LMP- menopausal     Objective:  Vitals:   05/22/22 1016  BP: 104/70  Pulse: (!) 59  Temp: 98.2 F (36.8 C)  TempSrc: Oral  SpO2: 95%  Weight: 163 lb 9.6 oz (74.2 kg)  Height: 5' 5"  (1.651 m)    General: Well developed, well nourished, in no acute distress  Skin : Warm and dry.  Head: Normocephalic and atraumatic  Eyes: Sclera and conjunctiva clear; pupils round and reactive to light; extraocular movements intact  Ears: External normal; canals clear; tympanic membranes normal  Oropharynx: Pink, supple. No suspicious lesions  Neck: Supple without thyromegaly, adenopathy  Lungs: Respirations unlabored; clear to auscultation bilaterally without wheeze, rales, rhonchi  CVS exam: normal rate and regular rhythm.  Neurologic: Alert and oriented; speech intact; face symmetrical; moves all extremities well; CNII-XII intact without focal deficit   Assessment:  1. Other fatigue   2. Hypothyroidism, unspecified type   3. Abnormal cervical Papanicolaou smear, unspecified abnormal pap finding   4. Need for immunization against influenza     Plan:  Will update labs today- may need to adjust  Synthroid; will give trial of Trazodone to see if this helps her sleep better; follow up to be determined; Refer to GYN- overdue for follow up there; Flu shot given;     No follow-ups on file.  Orders Placed This Encounter  Procedures   Flu Vaccine QUAD 73moIM (Fluarix, Fluzone & Alfiuria Quad PF)   CBC with Differential/Platelet   Comp Met (CMET)   TSH   B12   Ambulatory referral to Obstetrics / Gynecology    Referral Priority:   Routine    Referral Type:   Consultation    Referral Reason:   Specialty Services Required    Requested Specialty:   Obstetrics and Gynecology    Number of Visits Requested:   1    Requested Prescriptions   Signed Prescriptions Disp Refills   traZODone (DESYREL) 50  MG tablet 30 tablet 3    Sig: Take 0.5-1 tablets (25-50 mg total) by mouth at bedtime as needed for sleep.

## 2022-06-29 ENCOUNTER — Other Ambulatory Visit (HOSPITAL_COMMUNITY)
Admission: RE | Admit: 2022-06-29 | Discharge: 2022-06-29 | Disposition: A | Discharge status: Home / Self Care | Payer: Managed Care, Other (non HMO) | Source: Ambulatory Visit | Attending: Obstetrics & Gynecology | Admitting: Obstetrics & Gynecology

## 2022-06-29 ENCOUNTER — Encounter: Payer: Self-pay | Admitting: Obstetrics & Gynecology

## 2022-06-29 ENCOUNTER — Ambulatory Visit (INDEPENDENT_AMBULATORY_CARE_PROVIDER_SITE_OTHER): Payer: Managed Care, Other (non HMO) | Admitting: Obstetrics & Gynecology

## 2022-06-29 VITALS — BP 122/80 | Ht 65.0 in | Wt 166.0 lb

## 2022-06-29 DIAGNOSIS — Z01419 Encounter for gynecological examination (general) (routine) without abnormal findings: Secondary | ICD-10-CM

## 2022-06-29 DIAGNOSIS — N914 Secondary oligomenorrhea: Secondary | ICD-10-CM

## 2022-06-29 DIAGNOSIS — N870 Mild cervical dysplasia: Secondary | ICD-10-CM

## 2022-06-29 DIAGNOSIS — Z113 Encounter for screening for infections with a predominantly sexual mode of transmission: Secondary | ICD-10-CM

## 2022-06-29 DIAGNOSIS — R102 Pelvic and perineal pain: Secondary | ICD-10-CM

## 2022-06-29 NOTE — Progress Notes (Signed)
Susan Schmitt 1976/02/15 [id number]   History:    46 y.o. G5P3A2L3 S/P TL.  Boyfriend.  Daughter 22, Son 21, daughter 59.  RP:  Established patient presenting for annual gyn exam   HPI: Oligomenorrhea with only 2 menstrual periods in the last 4 years.  LMP in 12/2021 x 4 days of moderate flow.  No BTB.  Intermittent pelvic discomfort, crampy.  No vaginal discharge.  Pap 11/08/20 ASCUS/HPV HR pos.   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  Had a Negative Pap in 10/2018 with HPV HR pos.  Full STI screen otherwise Negative 10/2020.  Pap/HPV HR with Gono-Chlam today.  Requests STI screen.  Breasts normal.  Mammo Neg 11/2021.  Urine/BMs normal. Colono 2023.  BMI 27.62.  Health labs with FNP.    Past medical history,surgical history, family history and social history were all reviewed and documented in the EPIC chart.  Gynecologic History No LMP recorded (lmp unknown). (Menstrual status: Irregular Periods).  Obstetric History OB History  Gravida Para Term Preterm AB Living  5 2     2 3   SAB IAB Ectopic Multiple Live Births               # Outcome Date GA Lbr Len/2nd Weight Sex Delivery Anes PTL Lv  5 Gravida           4 AB           3 AB           2 Para           1 Para              ROS: A ROS was performed and pertinent positives and negatives are included in the history. GENERAL: No fevers or chills. HEENT: No change in vision, no earache, sore throat or sinus congestion. NECK: No pain or stiffness. CARDIOVASCULAR: No chest pain or pressure. No palpitations. PULMONARY: No shortness of breath, cough or wheeze. GASTROINTESTINAL: No abdominal pain, nausea, vomiting or diarrhea, melena or bright red blood per rectum. GENITOURINARY: No urinary frequency, urgency, hesitancy or dysuria. MUSCULOSKELETAL: No joint or muscle pain, no back pain, no recent trauma. DERMATOLOGIC: No rash, no itching, no lesions. ENDOCRINE: No polyuria, polydipsia, no heat or cold intolerance. No recent change in weight.  HEMATOLOGICAL: No anemia or easy bruising or bleeding. NEUROLOGIC: No headache, seizures, numbness, tingling or weakness. PSYCHIATRIC: No depression, no loss of interest in normal activity or change in sleep pattern.     Exam:   BP 122/80 (BP Location: Right Arm, Patient Position: Sitting, Cuff Size: Normal)   Ht 5\' 5"  (1.651 m)   Wt 166 lb (75.3 kg)   LMP  (LMP Unknown)   BMI 27.62 kg/m   Body mass index is 27.62 kg/m.  General appearance : Well developed well nourished female. No acute distress HEENT: Eyes: no retinal hemorrhage or exudates,  Neck supple, trachea midline, no carotid bruits, no thyroidmegaly Lungs: Clear to auscultation, no rhonchi or wheezes, or rib retractions  Heart: Regular rate and rhythm, no murmurs or gallops Breast:Examined in sitting and supine position were symmetrical in appearance, no palpable masses or tenderness,  no skin retraction, no nipple inversion, no nipple discharge, no skin discoloration, no axillary or supraclavicular lymphadenopathy Abdomen: no palpable masses or tenderness, no rebound or guarding Extremities: no edema or skin discoloration or tenderness  Pelvic: Vulva: Normal             Vagina: No gross lesions or  discharge  Cervix: No gross lesions or discharge.  Pap/HPV HR, Gono-Chlam done.  Uterus  AV, normal size, shape and consistency, mildly tender and mobile  Adnexa  Without masses or tenderness  Anus: Normal   Assessment/Plan:  46 y.o. female for annual exam   1. Encounter for routine gynecological examination with Papanicolaou smear of cervix Oligomenorrhea with only 2 menstrual periods in the last 4 years.  LMP in 12/2021 x 4 days of moderate flow.  No BTB.  Intermittent pelvic discomfort, crampy.  No vaginal discharge.  Pap 11/08/20 ASCUS/HPV HR pos.   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  Had a Negative Pap in 10/2018 with HPV HR pos.  Full STI screen otherwise Negative 10/2020.  Pap/HPV HR with Gono-Chlam today.  Requests STI  screen.  Breasts normal.  Mammo Neg 11/2021.  Urine/BMs normal. Colono 2023.  BMI 27.62.  Health labs with FNP. - Cytology - PAP( [place])  2. Mild cervical dysplasia, histologically confirmed - Cytology - PAP( [place])  3. Secondary oligomenorrhea Oligomenorrhea with only 2 menstrual periods in the last 4 years.  LMP in 12/2021 x 4 days of moderate flow.  No BTB.  Intermittent pelvic discomfort, crampy.  No vaginal discharge. If [place] in menopausal range, will start on HRT.  Counseling with risks/benefits and usage reviewed thoroughly with patient.  No CI.  Will make the final decision on HRT and prescribe as appropriate at the Pelvic US appointment. [facility] - US Transvaginal Non-OB; Future  4. Screen for STD (sexually transmitted disease) Strict condom use strongly recommended. - HIV antibody (with reflex) - RPR - Hepatitis B Surface AntiGEN - Hepatitis C Antibody - Cytology - PAP ([place]) with Gono-Chlam  5. Pelvic pain in female Intermittent pelvic discomfort, crampy.  No vaginal discharge. Mildly tender on gyn exam.  Will further investigate with a pelvic US at f/u. - US Transvaginal Non-OB; Future  Other orders - nitrofurantoin, macrocrystal-monohydrate, (MACROBID) 100 MG capsule; Take 100 mg by mouth every 12 (twelve) hours.   Princess Bruins MD, 8:43 AM 06/29/2022

## 2022-06-30 NOTE — Telephone Encounter (Signed)
Pelvic u/s is scheduled 07/30/22.

## 2022-07-01 LAB — HEPATITIS B SURFACE ANTIGEN: Hepatitis B Surface Ag: NONREACTIVE

## 2022-07-01 LAB — HIV ANTIBODY (ROUTINE TESTING W REFLEX): HIV 1&2 Ab, 4th Generation: NONREACTIVE

## 2022-07-01 LAB — RPR: RPR Ser Ql: NONREACTIVE

## 2022-07-01 LAB — FOLLICLE STIMULATING HORMONE: FSH: 73.8 m[IU]/mL

## 2022-07-01 LAB — HEPATITIS C ANTIBODY: Hepatitis C Ab: NONREACTIVE

## 2022-07-03 LAB — CYTOLOGY - PAP
Chlamydia: NEGATIVE
Comment: NEGATIVE
Comment: NEGATIVE
Comment: NORMAL
Diagnosis: NEGATIVE
High risk HPV: POSITIVE — AB
Neisseria Gonorrhea: NEGATIVE

## 2022-07-30 ENCOUNTER — Ambulatory Visit: Payer: Managed Care, Other (non HMO)

## 2022-07-30 ENCOUNTER — Ambulatory Visit: Payer: Managed Care, Other (non HMO) | Admitting: Obstetrics & Gynecology

## 2022-07-30 ENCOUNTER — Encounter: Payer: Self-pay | Admitting: Obstetrics & Gynecology

## 2022-07-30 VITALS — BP 110/80 | HR 68

## 2022-07-30 DIAGNOSIS — N914 Secondary oligomenorrhea: Secondary | ICD-10-CM | POA: Diagnosis not present

## 2022-07-30 DIAGNOSIS — R102 Pelvic and perineal pain: Secondary | ICD-10-CM | POA: Diagnosis not present

## 2022-07-30 DIAGNOSIS — R1032 Left lower quadrant pain: Secondary | ICD-10-CM

## 2022-07-30 NOTE — Progress Notes (Signed)
    Susan Schmitt 07-28-1976 [id number]        46 y.o.  [id number]   RP: LLQ pain for Pelvic US  HPI: Intermittent throbbing pain in mid to lower left abdomen.  No bulging/hernia.  Normal BMs, no change after a BM.  Urine normal.  No change in pain with position, exercise or sexual activity.  No fever.  Full STI screen Neg 06/29/22.  Pap Neg/HPV HR still positive on 06/29/22. Mild dysplasia on Colpo 12/2020.   OB History  Gravida Para Term Preterm AB Living  5 3 3   2 3   SAB IAB Ectopic Multiple Live Births    2          # Outcome Date GA Lbr Len/2nd Weight Sex Delivery Anes PTL Lv  5 Term           4 IAB           3 IAB           2 Term           1 Term             Past medical history,surgical history, problem list, medications, allergies, family history and social history were all reviewed and documented in the EPIC chart.   Directed ROS with pertinent positives and negatives documented in the history of present illness/assessment and plan.  Exam:  Vitals:   07/30/22 1614  BP: 110/80  Pulse: 68  SpO2: 99%   General appearance:  Normal  Abdomen:  Soft, not distended.  No evidence of hernia with lifting of the head.  Pelvic 08/01/22 Today: T/V images.  Anteverted uterus normal in size and shape with no myometrial mass.  The uterus is measured at 6.17 x 3.62 x 3.17 cm.  Thin and symmetrical endometrium measured at 2.41 mm with no mass or thickening seen.  Both ovaries are small with atrophic appearance and positive perfusion.  No adnexal mass.  No free fluid in the pelvis.  Nontender to vaginal exam.  FSH on June 29, 2022 was 73.8.   Assessment/Plan:  46 y.o. 46   1. Left lower quadrant pain  Intermittent throbbing pain in mid to lower left abdomen.  No bulging/hernia.  Normal BMs, no change after a BM.  Urine normal.  No change in pain with position, exercise or sexual activity.  No fever.  Full STI screen Neg 06/29/22.  Pap Neg/HPV HR still positive on 06/29/22.  Mild dysplasia on Colpo 12/2020.  Normal abdominal exam.  No hernia with lifting of the head.  Pelvic ultrasound completely normal.  The uterus is small and normal with a thin endometrial lining.  Both ovaries are small with atrophic appearance and positive perfusion.  No free fluid in the pelvis.  FSH in menopausal range.  Patient reassured.  Recommend further investigation through her family nurse practitioner, possibly including a CT scan or an MRI.  01/2021 MD, 4:34 PM 07/30/2022

## 2022-09-21 ENCOUNTER — Other Ambulatory Visit: Payer: Self-pay | Admitting: Family

## 2022-10-17 IMAGING — CT CT ABD-PELV W/ CM
1 of 3 series · 13 of 32 positions shown, 19 images · IV contrast (APPLIED)
Comparison: 100 mL Isovue 300

CLINICAL DATA: Left lower quadrant pain. Alternating diarrhea and
constipation.

EXAM:
CT ABDOMEN AND PELVIS WITH CONTRAST
TECHNIQUE: Multidetector CT imaging of the abdomen and pelvis was performed
using the standard protocol following bolus administration of
intravenous contrast.
CONTRAST:  100mL WUYY4I-5LL IOPAMIDOL (WUYY4I-5LL) INJECTION 61%

[Series 2: abd/pelvis w/cm · axial · 0.73mm/px · z∈[-454,-74]mm · 13 of 88 slices shown, 19 images]
[im 6/88  soft-tissue]
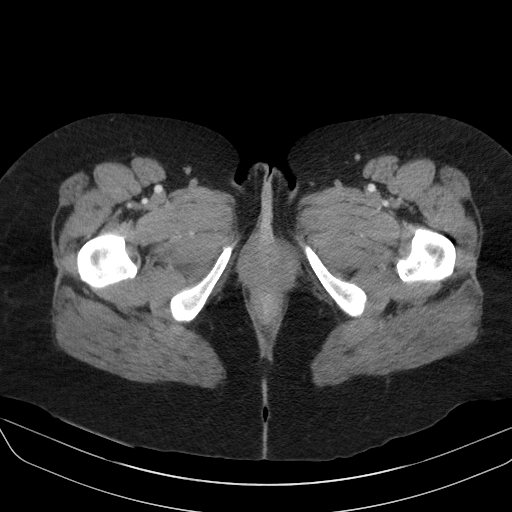
[im 6/88  bone]
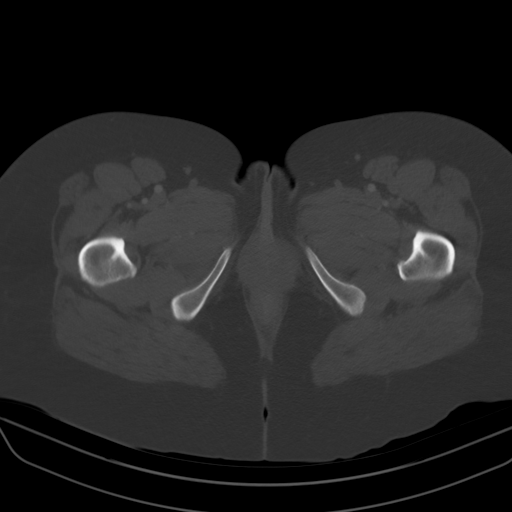
[im 12/88  soft-tissue]
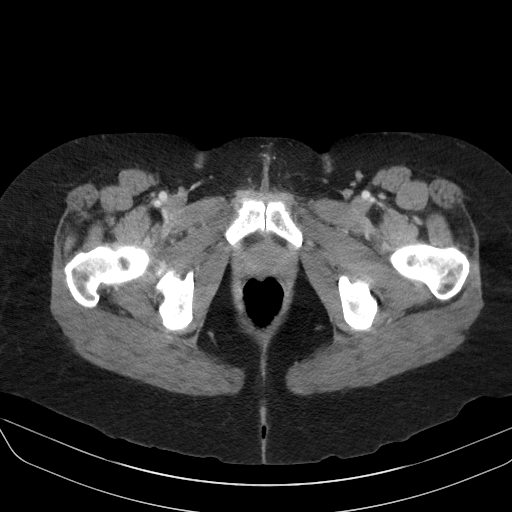
[im 18/88  soft-tissue]
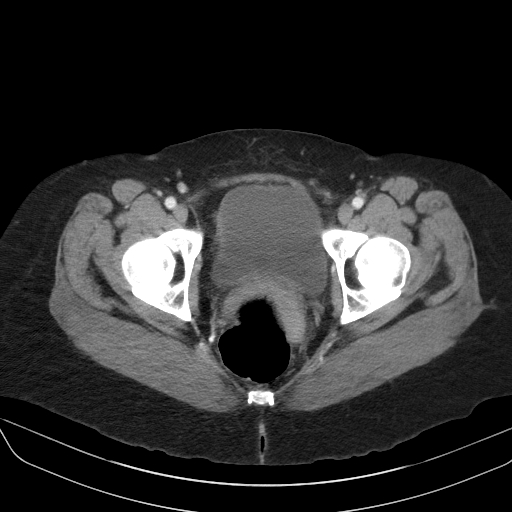
[im 24/88  soft-tissue]
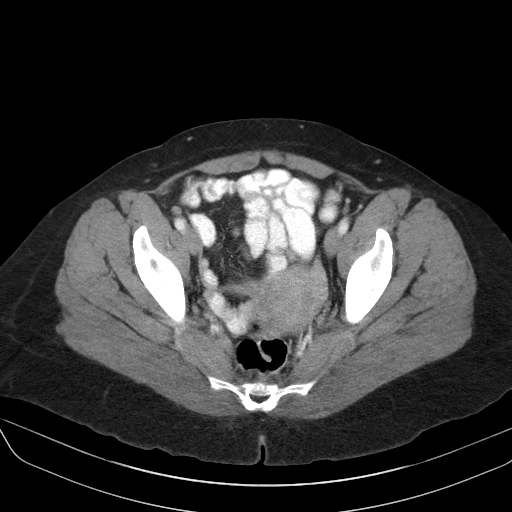
[im 30/88  soft-tissue]
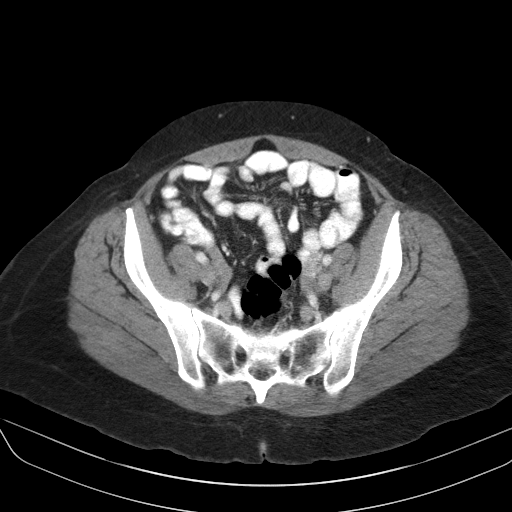
[im 35/88  soft-tissue]
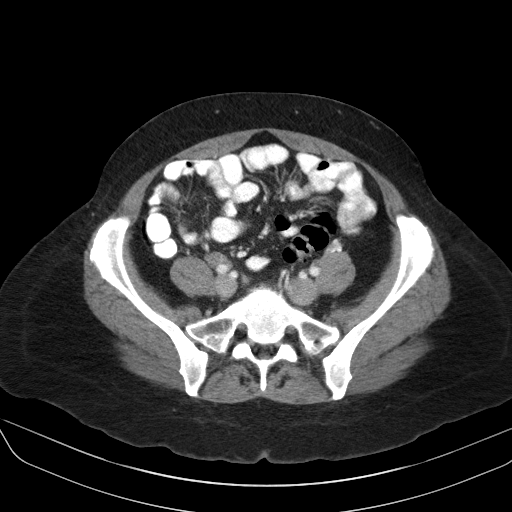
[im 47/88  soft-tissue]
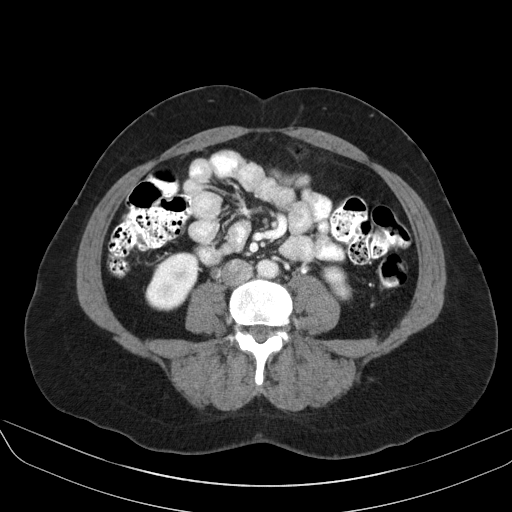
[im 53/88  soft-tissue]
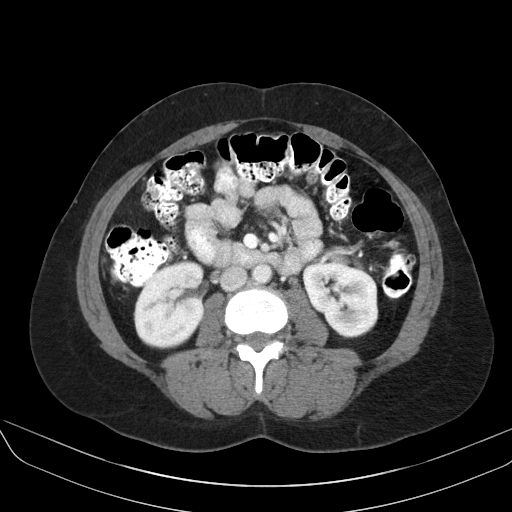
[im 59/88  soft-tissue]
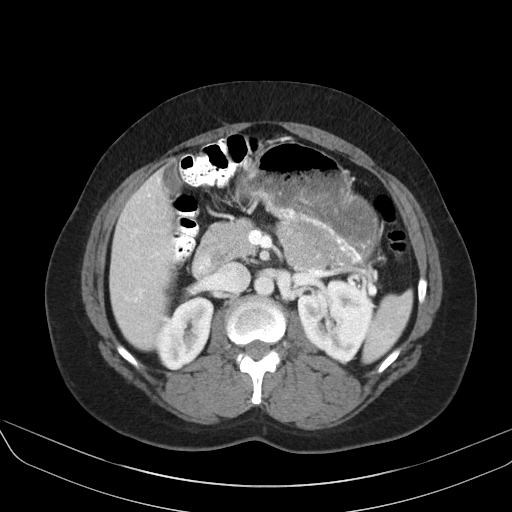
[im 59/88  bone]
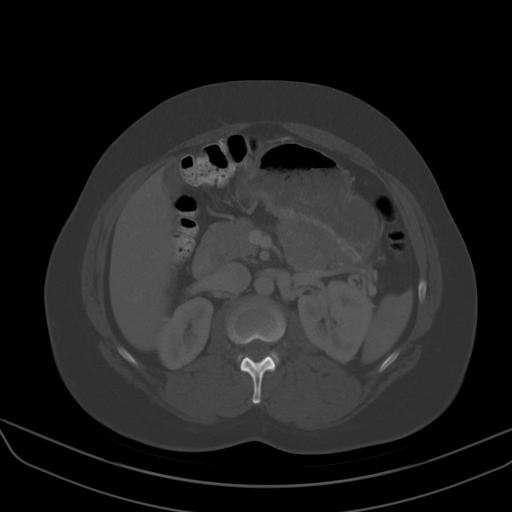
[im 64/88  soft-tissue]
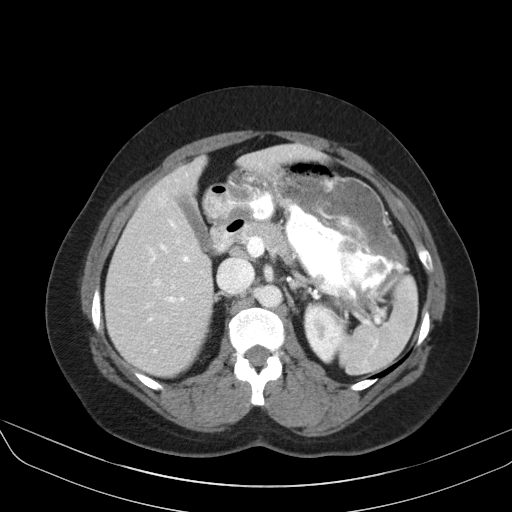
[im 64/88  lung]
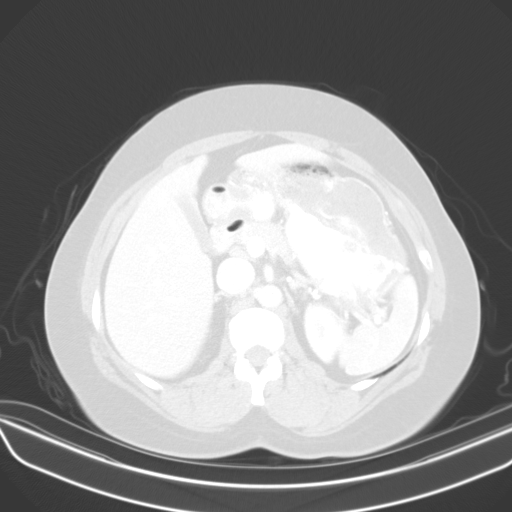
[im 70/88  soft-tissue]
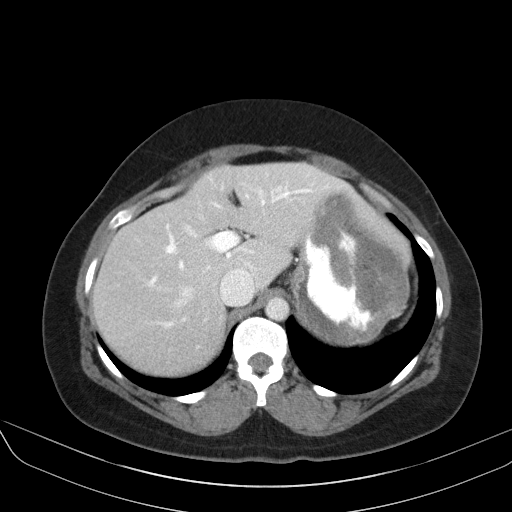
[im 70/88  lung]
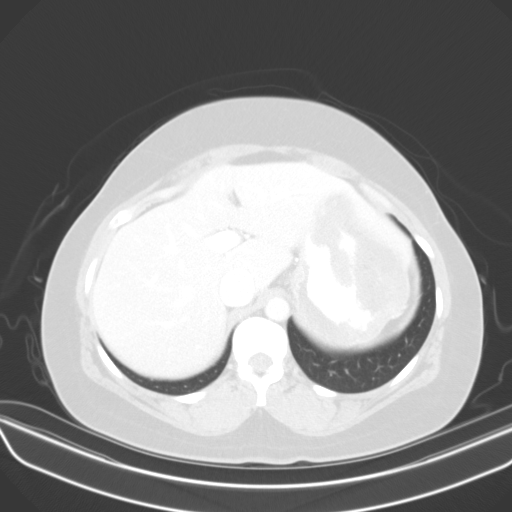
[im 76/88  soft-tissue]
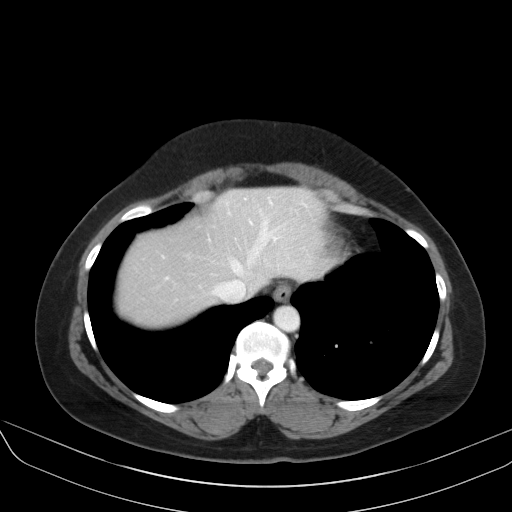
[im 76/88  lung]
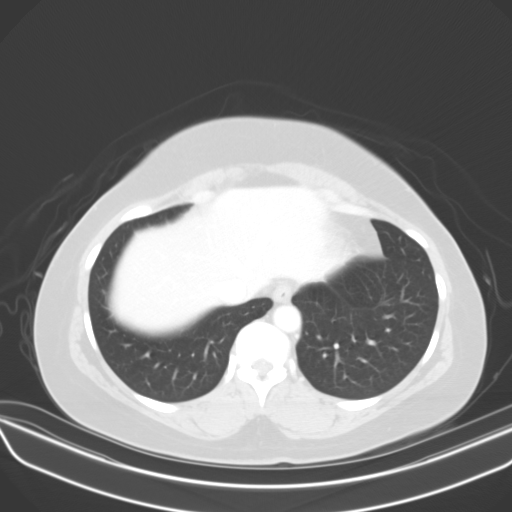
[im 82/88  soft-tissue]
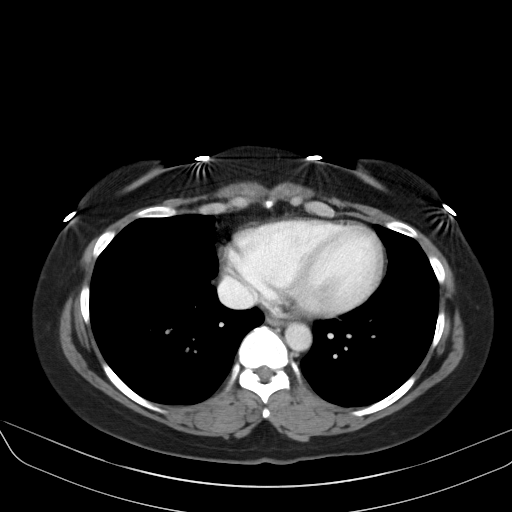
[im 82/88  lung]
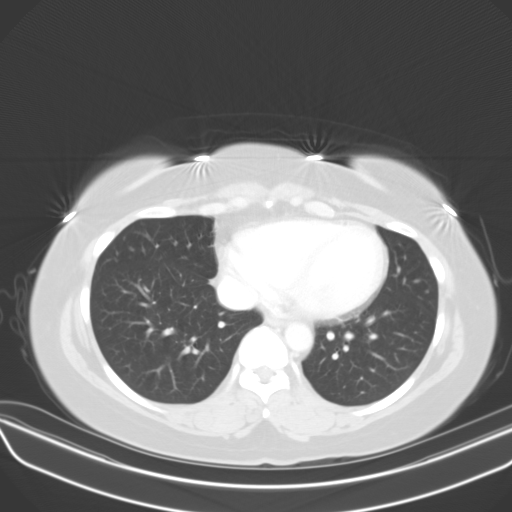

[13 of 32 positions shown; findings below may reference images not displayed]

FINDINGS: Lower Chest: No acute findings.

Hepatobiliary: No hepatic masses identified. Gallbladder is
unremarkable. No evidence of biliary ductal dilatation.

Pancreas:  No mass or inflammatory changes.

Spleen: Within normal limits in size and appearance.

Adrenals/Urinary Tract: No masses identified. No evidence of
ureteral calculi or hydronephrosis.

Stomach/Bowel: No evidence of obstruction, inflammatory process or
abnormal fluid collections. Normal appendix visualized.

Vascular/Lymphatic: No pathologically enlarged lymph nodes. No
abdominal aortic aneurysm. Aortic atherosclerotic calcification
noted.

Reproductive:  No mass or other significant abnormality.

Other:  None.

Musculoskeletal:  No suspicious bone lesions identified.
IMPRESSION: No acute findings or other significant abnormality.

Aortic Atherosclerosis ([id number]).

## 2022-10-18 ENCOUNTER — Other Ambulatory Visit: Payer: Self-pay | Admitting: Family

## 2023-05-17 ENCOUNTER — Encounter (HOSPITAL_BASED_OUTPATIENT_CLINIC_OR_DEPARTMENT_OTHER): Payer: Self-pay

## 2023-05-17 ENCOUNTER — Other Ambulatory Visit: Payer: Self-pay

## 2023-05-17 ENCOUNTER — Emergency Department (HOSPITAL_BASED_OUTPATIENT_CLINIC_OR_DEPARTMENT_OTHER)
Admission: EM | Admit: 2023-05-17 | Discharge: 2023-05-17 | Disposition: A | Discharge status: Home / Self Care | Payer: Managed Care, Other (non HMO) | Attending: Emergency Medicine | Admitting: Emergency Medicine

## 2023-05-17 DIAGNOSIS — M5442 Lumbago with sciatica, left side: Secondary | ICD-10-CM | POA: Diagnosis not present

## 2023-05-17 DIAGNOSIS — M545 Low back pain, unspecified: Secondary | ICD-10-CM | POA: Diagnosis present

## 2023-05-17 MED ORDER — IBUPROFEN 800 MG PO TABS
800.0000 mg | ORAL_TABLET | Freq: Once | ORAL | Status: AC
  Administered 2023-05-17: 800 mg via ORAL
  Filled 2023-05-17: qty 1

## 2023-05-17 MED ORDER — PREDNISONE 20 MG PO TABS: ORAL_TABLET | ORAL | 0 refills | Status: DC

## 2023-05-17 MED ORDER — CYCLOBENZAPRINE HCL 10 MG PO TABS: 10.0000 mg | ORAL_TABLET | Freq: Two times a day (BID) | ORAL | 0 refills | Status: DC | PRN

## 2023-05-17 MED ORDER — NAPROXEN 500 MG PO TABS: 500.0000 mg | ORAL_TABLET | Freq: Two times a day (BID) | ORAL | 0 refills | Status: DC

## 2023-05-17 NOTE — ED Provider Notes (Signed)
[place] [facility] Provider Note   CSN: [id number] Arrival date & time: 05/17/23  1331     History  Chief Complaint  Patient presents with   Back Pain    Susan Schmitt is a 47 y.o. female.  The history is provided by the patient and medical records. No language interpreter was used.  Back Pain    47 year old female significant history of lower back pain presenting with complaint of low back pain.  Patient reports today she was in the process of moving boxes when she was picking up a box and felt an acute onset of pain to her left lower back that radiates down to her left leg with associated numbness.  She also felt a pop and heard a pop.  Pain was acute and persistent.  She did take meloxicam belongs to her daughter prior to arrival.  She report minimal improvement with the medication.  She does not endorse any fever or chills no nausea no vomiting no urinary symptoms no hematuria or rash.  No bowel bladder incontinence or saddle anesthesia.  No history of IV drug use active cancer.  No history of diabetes.  Home Medications Prior to Admission medications   Medication Sig Start Date End Date Taking? Authorizing Provider  levothyroxine (SYNTHROID) 88 MCG tablet TAKE 1 TABLET BY MOUTH DAILY BEFORE BREAKFAST 09/21/22  Yes Olive Bass, FNP  traZODone (DESYREL) 50 MG tablet TAKE 1/2 TO 1 TABLET(25 TO 50 MG) BY MOUTH AT BEDTIME AS NEEDED FOR SLEEP 10/19/22   Olive Bass, FNP      Allergies    Patient has no known allergies.    Review of Systems   Review of Systems  Musculoskeletal:  Positive for back pain.  All other systems reviewed and are negative.   Physical Exam Updated Vital Signs BP 126/76 (BP Location: Right Arm)   Pulse 82   Temp 98.7 F (37.1 C)   Resp 20   SpO2 100%  Physical Exam Vitals and nursing note reviewed.  Constitutional:      General: She is not in acute distress.    Appearance: She is  well-developed.  HENT:     Head: Atraumatic.  Eyes:     Conjunctiva/sclera: Conjunctivae normal.  Pulmonary:     Effort: Pulmonary effort is normal.  Abdominal:     Palpations: Abdomen is soft.     Tenderness: There is no abdominal tenderness.  Musculoskeletal:        General: Tenderness (Tenderness to left lumbar paraspinal muscle on palpation with positive leg straight raise but patient able to ambulate.  Patellar deep tendon reflex intact bilaterally without foot drops.) present.     Cervical back: Neck supple.  Skin:    Capillary Refill: Capillary refill takes less than 2 seconds.     Findings: No rash.  Neurological:     Mental Status: She is alert.  Psychiatric:        Mood and Affect: Mood normal.     ED Results / Procedures / Treatments   Labs (all labs ordered are listed, but only abnormal results are displayed) Labs Reviewed - No data to display  EKG None  Radiology No results found.  Procedures Procedures    Medications Ordered in ED Medications  ibuprofen (ADVIL) tablet 800 mg (has no administration in time range)    ED Course/ Medical Decision Making/ A&P  Medical Decision Making  BP 126/76 (BP Location: Right Arm)   Pulse 82   Temp 98.7 F (37.1 C)   Resp 20   SpO2 100%   36:29 PM 47 year old female significant history of lower back pain presenting with complaint of low back pain.  Patient reports today she was in the process of moving boxes when she was picking up a box and felt an acute onset of pain to her left lower back that radiates down to her left leg with associated numbness.  She also felt a pop and heard a pop.  Pain was acute and persistent.  She did take meloxicam belongs to her daughter prior to arrival.  She report minimal improvement with the medication.  She does not endorse any fever or chills no nausea no vomiting no urinary symptoms no hematuria or rash.  No bowel bladder incontinence or saddle  anesthesia.  No history of IV drug use active cancer.  No history of diabetes.  On exam, patient is sitting in the chair is slightly uncomfortable.  She does have tenderness noted to her left lumbar paraspinal muscle as well as left lumbosacral region with positive straight leg raise.  She is neurovascular intact and able to ambulate.  She does not have any significant midline spine tenderness and no concerning rash noted.  DDx: Sciatica, lumbosacral strain, piriformis syndrome, shingle, cauda equina, kidney stone  Presentation is suggestive of left sciatica.  No red flags.  And patient able to ambulate.  Vital signs overall reassuring no fever no hypoxia.  Denies imaging including MRI of lumbar spine considered but not performed as I have low suspicion for cauda equina.  She is without any urinary symptoms therefore additional lab work is not considered at this time.  Plan to provide patient with supportive care which including prednisone, muscle relaxant, and intolerant to medication as well as outpatient follow-up with orthopedic specialist as needed.  Return precaution given.  Patient given ibuprofen for pain with improvement of symptoms.        Final Clinical Impression(s) / ED Diagnoses Final diagnoses:  Acute back pain with sciatica, left    Rx / DC Orders ED Discharge Orders          Ordered    naproxen (NAPROSYN) 500 MG tablet  2 times daily        05/17/23 1624    predniSONE (DELTASONE) 20 MG tablet        05/17/23 1624    cyclobenzaprine (FLEXERIL) 10 MG tablet  2 times daily PRN        05/17/23 1624              Fayrene Helper, PA-C 05/17/23 1625    Laurence Spates, MD 05/18/23 1233

## 2023-05-17 NOTE — ED Triage Notes (Addendum)
Pt reports lifting boxes approx 30 mins ago and heard/felt pop left lower back. Pain radiating left leg . Tingling down both legs

## 2023-10-21 ENCOUNTER — Other Ambulatory Visit: Payer: Self-pay | Admitting: Family

## 2023-10-21 ENCOUNTER — Encounter: Payer: Self-pay | Admitting: Family

## 2023-10-22 ENCOUNTER — Telehealth: Payer: Self-pay

## 2023-10-22 NOTE — Telephone Encounter (Signed)
 Initial Comment Caller is experiencing heavy vaginal bleeding. Caller says she has not had a period in 5 years. Translation No Nurse Assessment Nurse: Shirleen, RN, Callie Date/Time (Eastern Time): 10/22/2023 2:28:26 PM Confirm and document reason for call. If symptomatic, describe symptoms. ---Caller states she has heavy vaginal bleeding that started today. She states she feels abdominal pressure. She has not had a period in 5 years. She has had to apply 2 tampons at one time and has had to change twice. Does the patient have any new or worsening symptoms? ---Yes Will a triage be completed? ---Yes Related visit to physician within the last 2 weeks? ---No Does the PT have any chronic conditions? (i.e. diabetes, asthma, this includes High risk factors for pregnancy, etc.) ---No Is the patient pregnant or possibly pregnant? (Ask all females between the ages of 68-55) ---No Is this a behavioral health or substance abuse call? ---No Guidelines Guideline Title Affirmed Question Affirmed Notes Nurse Date/Time (Eastern Time) Vaginal Bleeding - Abnormal MODERATE vaginal bleeding (e.g., soaking 1 pad or tampon per hour and present > 6 hours; 1 menstrual cup every 6 hours) [place], RN, [facility] 10/22/2023 2:30:19 PM PLEASE NOTE: All timestamps contained within this report are represented as Eastern Standard Time. CONFIDENTIALTY NOTICE: This fax transmission is intended only for the addressee. It contains information that is legally privileged, confidential or otherwise protected from use or disclosure. If you are not the intended recipient, you are strictly prohibited from reviewing, disclosing, copying using or disseminating any of this information or taking any action in reliance on or regarding this information. If you have received this fax in error, please notify us  immediately by telephone so that we can arrange for its return to us . Phone: [phone], Toll-Free: [phone], Fax:  [phone] Page: 2 of 2 Call Id: [id number] Disp. Time [place] Time) Disposition Final User 10/22/2023 2:37:42 PM See HCP within 4 Hours (or PCP triage) Yes Shirleen, RN, Callie Final Disposition 10/22/2023 2:37:42 PM See HCP within 4 Hours (or PCP triage) Yes Shirleen, RN, Aline Caller Disagree/Comply Comply Caller Understands Yes PreDisposition Did not know what to do Care Advice Given Per Guideline SEE HCP (OR PCP TRIAGE) WITHIN 4 HOURS: * IF OFFICE WILL BE CLOSED AND NO PCP (PRIMARY CARE PROVIDER) SECOND-LEVEL TRIAGE: You need to be seen within the next 3 or 4 hours. A nearby Urgent Care Center [facility]) is often a good source of care. Another choice is to go to the ED. Go sooner if you become worse. CALL BACK IF: * You become worse CARE ADVICE given per Vaginal Bleeding - Abnormal (Adult) guideline. Comments User: Callie, [place], RN Date/Time [place] Time): 10/22/2023 2:37:24 PM Unable to warm transfer to back line. P t states she currently on a road trip, states she will look up closest UC to be evaluated. Referrals GO TO FACILITY UNDECIDED

## 2023-10-26 ENCOUNTER — Encounter: Payer: Self-pay | Admitting: Family

## 2023-10-26 ENCOUNTER — Other Ambulatory Visit: Payer: Self-pay | Admitting: Family

## 2023-10-26 ENCOUNTER — Ambulatory Visit: Payer: Medicaid Other | Admitting: Family

## 2023-10-26 VITALS — BP 118/82 | HR 64 | Ht 65.0 in | Wt 178.0 lb

## 2023-10-26 DIAGNOSIS — E039 Hypothyroidism, unspecified: Secondary | ICD-10-CM | POA: Diagnosis not present

## 2023-10-26 DIAGNOSIS — Z1231 Encounter for screening mammogram for malignant neoplasm of breast: Secondary | ICD-10-CM

## 2023-10-26 DIAGNOSIS — N939 Abnormal uterine and vaginal bleeding, unspecified: Secondary | ICD-10-CM | POA: Diagnosis not present

## 2023-10-26 DIAGNOSIS — E079 Disorder of thyroid, unspecified: Secondary | ICD-10-CM

## 2023-10-26 LAB — CBC WITH DIFFERENTIAL/PLATELET
Basophils Absolute: 0 10*3/uL (ref 0.0–0.1)
Basophils Relative: 1 % (ref 0.0–3.0)
Eosinophils Absolute: 0.3 10*3/uL (ref 0.0–0.7)
Eosinophils Relative: 5.8 % — ABNORMAL HIGH (ref 0.0–5.0)
HCT: 35.1 % — ABNORMAL LOW (ref 36.0–46.0)
Hemoglobin: 11.8 g/dL — ABNORMAL LOW (ref 12.0–15.0)
Lymphocytes Relative: 35.9 % (ref 12.0–46.0)
Lymphs Abs: 1.9 10*3/uL (ref 0.7–4.0)
MCHC: 33.7 g/dL (ref 30.0–36.0)
MCV: 96.9 fL (ref 78.0–100.0)
Monocytes Absolute: 0.4 10*3/uL (ref 0.1–1.0)
Monocytes Relative: 8.6 % (ref 3.0–12.0)
Neutro Abs: 2.5 10*3/uL (ref 1.4–7.7)
Neutrophils Relative %: 48.7 % (ref 43.0–77.0)
Platelets: 260 10*3/uL (ref 150.0–400.0)
RBC: 3.62 Mil/uL — ABNORMAL LOW (ref 3.87–5.11)
RDW: 13.4 % (ref 11.5–15.5)
WBC: 5.2 10*3/uL (ref 4.0–10.5)

## 2023-10-26 LAB — COMPREHENSIVE METABOLIC PANEL
ALT: 11 U/L (ref 0–35)
AST: 16 U/L (ref 0–37)
Albumin: 4.2 g/dL (ref 3.5–5.2)
Alkaline Phosphatase: 56 U/L (ref 39–117)
BUN: 9 mg/dL (ref 6–23)
CO2: 29 meq/L (ref 19–32)
Calcium: 8.9 mg/dL (ref 8.4–10.5)
Chloride: 106 meq/L (ref 96–112)
Creatinine, Ser: 0.77 mg/dL (ref 0.40–1.20)
GFR: 91.88 mL/min (ref >60.00)
Glucose, Bld: 92 mg/dL (ref 70–99)
Potassium: 4.1 meq/L (ref 3.5–5.1)
Sodium: 141 meq/L (ref 135–145)
Total Bilirubin: 0.5 mg/dL (ref 0.2–1.2)
Total Protein: 6.7 g/dL (ref 6.0–8.3)

## 2023-10-26 LAB — TSH: TSH: 16.88 u[IU]/mL — ABNORMAL HIGH (ref 0.35–5.50)

## 2023-10-26 MED ORDER — LEVOTHYROXINE SODIUM 88 MCG PO TABS: 88.0000 ug | ORAL_TABLET | Freq: Every day | ORAL | 0 refills | Status: DC

## 2023-10-26 NOTE — Progress Notes (Deleted)
Susan Schmitt is a 48 y.o. female with the following history as recorded in EpicCare:  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/27/2016   History of female sterilization 01/30/2016   Hypothyroidism 01/30/2016    Current Outpatient Medications  Medication Sig Dispense Refill   levothyroxine (SYNTHROID) 88 MCG tablet TAKE 1 TABLET BY MOUTH DAILY BEFORE BREAKFAST 30 tablet 0   traZODone (DESYREL) 50 MG tablet TAKE 1/2 TO 1 TABLET(25 TO 50 MG) BY MOUTH AT BEDTIME AS NEEDED FOR SLEEP 30 tablet 3   No current facility-administered medications for this visit.    Allergies: Patient has no known allergies.  Past Medical History:  Diagnosis Date   Allergy    Hypothyroid    Radiation    thyroid    Past Surgical History:  Procedure Laterality Date   FOOT SURGERY Right    THUMB ARTHROSCOPY     TUBAL LIGATION      Family History  Problem Relation Age of Onset   COPD Mother    Depression Father    Cancer Brother    Non-Hodgkin's lymphoma Brother    Cancer Maternal Grandfather        prostate   Stomach cancer Paternal Grandmother    Breast cancer Paternal Grandmother    Cancer Paternal Grandmother        stomach   Colon cancer Neg Hx    Colon polyps Neg Hx    Esophageal cancer Neg Hx    Rectal cancer Neg Hx     Social History   Tobacco Use   Smoking status: Former    Current packs/day: 0.00    Types: Cigarettes    Quit date: 01/06/2009    Years since quitting: 14.8   Smokeless tobacco: Never  Substance Use Topics   Alcohol use: Yes    Comment: social    Subjective:  ***  @HCCSF @  Objective:  There were no vitals filed for this visit.  General: Well developed, well nourished, in no acute distress  Skin : Warm and dry.  Head: Normocephalic and atraumatic  Eyes: Sclera and conjunctiva  clear; pupils round and reactive to light; extraocular movements intact  Ears: External normal; canals clear; tympanic membranes normal  Oropharynx: Pink, supple. No suspicious lesions  Neck: Supple without thyromegaly, adenopathy  Lungs: Respirations unlabored; clear to auscultation bilaterally without wheeze, rales, rhonchi  CVS exam: {heart exam:315510::"normal rate, regular rhythm, normal S1, S2, no murmurs, rubs, clicks or gallops"}.  Abdomen: Soft; nontender; nondistended; normoactive bowel sounds; no masses or hepatosplenomegaly  Musculoskeletal: No deformities; no active joint inflammation  Extremities: No edema, cyanosis, clubbing  Vessels: Symmetric bilaterally  Neurologic: Alert and oriented; speech intact; face symmetrical; moves all extremities well; CNII-XII intact without focal deficit  Assessment:  1. Vaginal bleeding   2. Visit for screening mammogram   3. Hypothyroidism, unspecified type     Plan:  ***   No follow-ups on file.  Orders Placed This Encounter  Procedures   MM Digital Screening    Standing Status:   Future    Expiration Date:   10/25/2024    Reason for Exam (SYMPTOM  OR DIAGNOSIS REQUIRED):   screening mammogram    Is the patient pregnant?:   No    Preferred imaging location?:   [facility]   CBC  with Differential/Platelet   Comp Met (CMET)   TSH   Ambulatory referral to Gynecology    Referral Priority:   Emergency    Referral Type:   Consultation    Referral Reason:   Specialty Services Required    Requested Specialty:   Gynecology    Number of Visits Requested:   1    Requested Prescriptions    No prescriptions requested or ordered in this encounter

## 2023-10-26 NOTE — Progress Notes (Signed)
Susan Schmitt is a 48 y.o. female with the following history as recorded in EpicCare:  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/27/2016   History of female sterilization 01/30/2016   Hypothyroidism 01/30/2016    Current Outpatient Medications  Medication Sig Dispense Refill   levothyroxine (SYNTHROID) 88 MCG tablet TAKE 1 TABLET BY MOUTH DAILY BEFORE BREAKFAST 30 tablet 0   traZODone (DESYREL) 50 MG tablet TAKE 1/2 TO 1 TABLET(25 TO 50 MG) BY MOUTH AT BEDTIME AS NEEDED FOR SLEEP 30 tablet 3   No current facility-administered medications for this visit.    Allergies: Patient has no known allergies.  Past Medical History:  Diagnosis Date   Allergy    Hypothyroid    Radiation    thyroid    Past Surgical History:  Procedure Laterality Date   FOOT SURGERY Right    THUMB ARTHROSCOPY     TUBAL LIGATION      Family History  Problem Relation Age of Onset   COPD Mother    Depression Father    Cancer Brother    Non-Hodgkin's lymphoma Brother    Cancer Maternal Grandfather        prostate   Stomach cancer Paternal Grandmother    Breast cancer Paternal Grandmother    Cancer Paternal Grandmother        stomach   Colon cancer Neg Hx    Colon polyps Neg Hx    Esophageal cancer Neg Hx    Rectal cancer Neg Hx     Social History   Tobacco Use   Smoking status: Former    Current packs/day: 0.00    Types: Cigarettes    Quit date: 01/06/2009    Years since quitting: 14.8   Smokeless tobacco: Never  Substance Use Topics   Alcohol use: Yes    Comment: social    Subjective:   Started with vaginal bleeding on 10/21/23; was found to be menopausal in 2023/ had not had a period in the past 5 years; Does have history of abnormal pap smear- was due for repeat pap smear in 12/2022 but unable to keep that  appointment;  Does feel that bleeding is slowing done; no pelvic pain- "feels like a lot of pressure" Notes that stress level is very high- unexpectedly lost job;     Objective:  Vitals:   10/26/23 0802  BP: 118/82  Pulse: 64  SpO2: 99%  Weight: 178 lb (80.7 kg)  Height: 5\' 5"  (1.651 m)    General: Well developed, well nourished, in no acute distress  Skin : Warm and dry.  Head: Normocephalic and atraumatic  Eyes: Sclera and conjunctiva clear; pupils round and reactive to light; extraocular movements intact  Ears: External normal; canals clear; tympanic membranes normal  Oropharynx: Pink, supple. No suspicious lesions  Neck: Supple without thyromegaly, adenopathy  Lungs: Respirations unlabored; clear to auscultation bilaterally without wheeze, rales, rhonchi  CVS exam: normal rate and regular rhythm.  Neurologic: Alert and oriented; speech intact; face symmetrical; moves all extremities well; CNII-XII intact without focal deficit   Pelvic exam deferred due to active bleeding;  Assessment:  1. Vaginal bleeding   2. Hypothyroidism, unspecified type     Plan:  STAT referral back to GYN; check CBC, CMP today; Check TSH today to ensure dosage of Synthroid is correct;  Follow up to be determined; order for screening mammogram updated as well;   No follow-ups on file.  Orders Placed This Encounter  Procedures   CBC with Differential/Platelet   Comp Met (CMET)   TSH    Requested Prescriptions    No prescriptions requested or ordered in this encounter

## 2023-10-28 ENCOUNTER — Ambulatory Visit (HOSPITAL_BASED_OUTPATIENT_CLINIC_OR_DEPARTMENT_OTHER)
Admission: RE | Admit: 2023-10-28 | Discharge: 2023-10-28 | Disposition: A | Discharge status: Home / Self Care | Payer: Medicaid Other | Source: Ambulatory Visit | Attending: Family | Admitting: Family

## 2023-10-28 ENCOUNTER — Encounter (HOSPITAL_BASED_OUTPATIENT_CLINIC_OR_DEPARTMENT_OTHER): Payer: Self-pay

## 2023-10-28 DIAGNOSIS — Z1231 Encounter for screening mammogram for malignant neoplasm of breast: Secondary | ICD-10-CM | POA: Diagnosis present

## 2023-10-28 DIAGNOSIS — E079 Disorder of thyroid, unspecified: Secondary | ICD-10-CM | POA: Insufficient documentation

## 2023-10-29 ENCOUNTER — Ambulatory Visit (HOSPITAL_BASED_OUTPATIENT_CLINIC_OR_DEPARTMENT_OTHER)
Admission: RE | Admit: 2023-10-29 | Discharge: 2023-10-29 | Disposition: A | Discharge status: Home / Self Care | Payer: Medicaid Other | Source: Ambulatory Visit | Attending: Family | Admitting: Family

## 2023-10-29 DIAGNOSIS — E079 Disorder of thyroid, unspecified: Secondary | ICD-10-CM

## 2023-11-02 ENCOUNTER — Ambulatory Visit (INDEPENDENT_AMBULATORY_CARE_PROVIDER_SITE_OTHER): Payer: Medicaid Other | Admitting: Obstetrics and Gynecology

## 2023-11-02 ENCOUNTER — Encounter: Payer: Self-pay | Admitting: Obstetrics and Gynecology

## 2023-11-02 VITALS — BP 122/88 | HR 70

## 2023-11-02 DIAGNOSIS — N950 Postmenopausal bleeding: Secondary | ICD-10-CM | POA: Diagnosis not present

## 2023-11-02 DIAGNOSIS — E2839 Other primary ovarian failure: Secondary | ICD-10-CM | POA: Diagnosis not present

## 2023-11-02 NOTE — Progress Notes (Signed)
48 y.o. y.o. female here for annual exam. No LMP recorded. (Menstrual status: Other).    Patient reports she has not had a period for 5 yeas and had a 6 day period that was heavy for 2 days then decreased from there. She does not want to have another period and does not want any hormones She is not currently on any HRT She reports the bleeding that was heavy was 2 tampons at one time every couple of hours. She did notice some clots and it was painful. She also has a history of abnormal pap smears  MMG 10/28/23 birads 1 neg Colonoscopy 03/25/22 multiple diverticuli seen. Repeat in 10 years Dxa:    to get baseline with hypoestrogen and risks with hypothyroidism TSH was high on 2/11 (16)  There is no height or weight on file to calculate BMI.     10/26/2023    8:34 AM 10/26/2023    8:13 AM 05/22/2022   10:17 AM  Depression screen PHQ 2/9  Decreased Interest 0 0 0  Down, Depressed, Hopeless 0 0 0  PHQ - 2 Score 0 0 0    Blood pressure 122/88, pulse 70, SpO2 100%.     Component Value Date/Time   DIAGPAP  06/29/2022 0907    - Negative for intraepithelial lesion or malignancy (NILM)   DIAGPAP - Low grade squamous intraepithelial lesion (LSIL) (A) 01/06/2021 1443   DIAGPAP (A) 11/08/2020 1523    - Atypical squamous cells of undetermined significance (ASC-US)   HPVHIGH Positive (A) 06/29/2022 0907   HPVHIGH Positive (A) 01/06/2021 1443   HPVHIGH Positive (A) 11/08/2020 1523   ADEQPAP  06/29/2022 0907    Satisfactory for evaluation; transformation zone component PRESENT.   ADEQPAP  01/06/2021 1443    Satisfactory for evaluation; transformation zone component PRESENT.   ADEQPAP  11/08/2020 1523    Satisfactory for evaluation; transformation zone component PRESENT.    GYN HISTORY:    Component Value Date/Time   DIAGPAP  06/29/2022 0907    - Negative for intraepithelial lesion or malignancy (NILM)   DIAGPAP - Low grade squamous intraepithelial lesion (LSIL) (A) 01/06/2021  1443   DIAGPAP (A) 11/08/2020 1523    - Atypical squamous cells of undetermined significance (ASC-US)   HPVHIGH Positive (A) 06/29/2022 0907   HPVHIGH Positive (A) 01/06/2021 1443   HPVHIGH Positive (A) 11/08/2020 1523   ADEQPAP  06/29/2022 0907    Satisfactory for evaluation; transformation zone component PRESENT.   ADEQPAP  01/06/2021 1443    Satisfactory for evaluation; transformation zone component PRESENT.   ADEQPAP  11/08/2020 1523    Satisfactory for evaluation; transformation zone component PRESENT.    OB History  Gravida Para Term Preterm AB Living  5 3 3  2 3   SAB IAB Ectopic Multiple Live Births   2       # Outcome Date GA Lbr Len/2nd Weight Sex Type Anes PTL Lv  5 Term           4 IAB           3 IAB           2 Term           1 Term             Past Medical History:  Diagnosis Date   Allergy    Hypothyroid    Radiation    thyroid    Past Surgical History:  Procedure Laterality Date   FOOT  SURGERY Right    THUMB ARTHROSCOPY     TUBAL LIGATION      Current Outpatient Medications on File Prior to Visit  Medication Sig Dispense Refill   levothyroxine (SYNTHROID) 88 MCG tablet Take 1 tablet (88 mcg total) by mouth daily before breakfast. 30 tablet 0   Multiple Vitamin (MULTIVITAMIN PO) Take by mouth.     traZODone (DESYREL) 50 MG tablet TAKE 1/2 TO 1 TABLET(25 TO 50 MG) BY MOUTH AT BEDTIME AS NEEDED FOR SLEEP (Patient not taking: Reported on 11/02/2023) 30 tablet 3   No current facility-administered medications on file prior to visit.    Social History   Socioeconomic History   Marital status: Single    Spouse name: Not on file   Number of children: Not on file   Years of education: Not on file   Highest education level: Not on file  Occupational History   Not on file  Tobacco Use   Smoking status: Former    Current packs/day: 0.00    Types: Cigarettes    Quit date: 01/06/2009    Years since quitting: 14.8   Smokeless tobacco: Never  Vaping  Use   Vaping status: Never Used  Substance and Sexual Activity   Alcohol use: Yes    Comment: social   Drug use: No   Sexual activity: Yes    Partners: Male    Birth control/protection: Surgical    Comment: 1st intercourse- 14, partners- 10+, BTL  Other Topics Concern   Not on file  Social History Narrative   Not on file   Social Drivers of Health   Financial Resource Strain: Not on file  Food Insecurity: Not on file  Transportation Needs: Not on file  Physical Activity: Not on file  Stress: Not on file  Social Connections: Not on file  Intimate Partner Violence: Not on file    Family History  Problem Relation Age of Onset   COPD Mother    Depression Father    Cancer Brother    Non-Hodgkin's lymphoma Brother    Cancer Maternal Grandfather        prostate   Stomach cancer Paternal Grandmother    Breast cancer Paternal Grandmother    Cancer Paternal Grandmother        stomach   Colon cancer Neg Hx    Colon polyps Neg Hx    Esophageal cancer Neg Hx    Rectal cancer Neg Hx      No Known Allergies    Patient's last menstrual period was No LMP recorded. (Menstrual status: Other)..            Review of Systems Alls systems reviewed and are negative.     OBGyn Exam    A:         PM bleeding consult                             P:        RTC for annual exam, ultrasound and EMB.  Counseled on the importance of the EMB to rule out any precancerous or cancerous changes. She is frustrated with the bleeding and would like a definitive management.  She does not want to do multiple procedures in the future or take hormones and does not want to have the cancer risk.  Counseled on all options.  She would like to have the [facility].  Counseled extensively on the procedure including but not  limited to what to expect and risks and benefits.  Counseled on postop care and pelvic rest for 10 weeks after the surgery with restricted lifting for 6 weeks after.  Counseled on the  benefits of the robotic procedure with faster return to daily activities, improved outcomes, and less risk for complications. She would like to have this scheduled.  20 minutes spent on reviewing records, imaging,  and one on one patient time and counseling patient and documentation Dr. Karma Greaser  No follow-ups on file.  Earley Favor

## 2023-11-02 NOTE — Patient Instructions (Signed)
Susan Schmitt It was nice meeting you today.  Don't forget to take some motrin an hour before the biopsy. I also put in a baseline bone scan with low estrogen and having thyroid disorder. Dr. Karma Greaser

## 2023-11-08 ENCOUNTER — Other Ambulatory Visit: Payer: Self-pay | Admitting: Family

## 2023-11-08 ENCOUNTER — Encounter: Payer: Self-pay | Admitting: Family

## 2023-11-08 MED ORDER — LEVOTHYROXINE SODIUM 88 MCG PO TABS: 88.0000 ug | ORAL_TABLET | Freq: Every day | ORAL | 0 refills | Status: DC

## 2023-11-13 DIAGNOSIS — Z419 Encounter for procedure for purposes other than remedying health state, unspecified: Secondary | ICD-10-CM | POA: Diagnosis not present

## 2023-11-30 ENCOUNTER — Other Ambulatory Visit (HOSPITAL_COMMUNITY)
Admission: RE | Admit: 2023-11-30 | Discharge: 2023-11-30 | Disposition: A | Discharge status: Home / Self Care | Source: Ambulatory Visit | Attending: Obstetrics and Gynecology | Admitting: Obstetrics and Gynecology

## 2023-11-30 ENCOUNTER — Ambulatory Visit (INDEPENDENT_AMBULATORY_CARE_PROVIDER_SITE_OTHER): Payer: Medicaid Other | Admitting: Obstetrics and Gynecology

## 2023-11-30 ENCOUNTER — Ambulatory Visit (INDEPENDENT_AMBULATORY_CARE_PROVIDER_SITE_OTHER): Payer: Medicaid Other

## 2023-11-30 ENCOUNTER — Encounter: Payer: Self-pay | Admitting: Obstetrics and Gynecology

## 2023-11-30 VITALS — BP 110/80 | HR 69

## 2023-11-30 DIAGNOSIS — Z01419 Encounter for gynecological examination (general) (routine) without abnormal findings: Secondary | ICD-10-CM | POA: Insufficient documentation

## 2023-11-30 DIAGNOSIS — N858 Other specified noninflammatory disorders of uterus: Secondary | ICD-10-CM | POA: Diagnosis not present

## 2023-11-30 DIAGNOSIS — Z8742 Personal history of other diseases of the female genital tract: Secondary | ICD-10-CM | POA: Insufficient documentation

## 2023-11-30 DIAGNOSIS — N950 Postmenopausal bleeding: Secondary | ICD-10-CM

## 2023-11-30 NOTE — Progress Notes (Signed)
 48 y.o. y.o. female here for annual exam, EMB, PUS. No LMP recorded (lmp unknown). Patient is postmenopausal.    Patient reports she has not had a period for 5 yeas and had a 6 day period that was heavy for 2 days then decreased from there. She does not want to have another period and does not want any hormones She is not currently on any HRT She reports the bleeding that was heavy was 2 tampons at one time every couple of hours. She did notice some clots and it was painful. She also has a history of abnormal pap smears  MMG 10/28/23 birads 1 neg Colonoscopy 03/25/22 multiple diverticuli seen. Repeat in 10 years Dxa:    to get baseline with hypoestrogen and risks with hypothyroidism TSH was high on 2/11 (16) LGSIL 2022 There is no height or weight on file to calculate BMI.     10/26/2023    8:34 AM 10/26/2023    8:13 AM 05/22/2022   10:17 AM  Depression screen PHQ 2/9  Decreased Interest 0 0 0  Down, Depressed, Hopeless 0 0 0  PHQ - 2 Score 0 0 0    Pulse 69, SpO2 99%.     Component Value Date/Time   DIAGPAP  06/29/2022 0907    - Negative for intraepithelial lesion or malignancy (NILM)   DIAGPAP - Low grade squamous intraepithelial lesion (LSIL) (A) 01/06/2021 1443   DIAGPAP (A) 11/08/2020 1523    - Atypical squamous cells of undetermined significance (ASC-US)   HPVHIGH Positive (A) 06/29/2022 0907   HPVHIGH Positive (A) 01/06/2021 1443   HPVHIGH Positive (A) 11/08/2020 1523   ADEQPAP  06/29/2022 0907    Satisfactory for evaluation; transformation zone component PRESENT.   ADEQPAP  01/06/2021 1443    Satisfactory for evaluation; transformation zone component PRESENT.   ADEQPAP  11/08/2020 1523    Satisfactory for evaluation; transformation zone component PRESENT.    GYN HISTORY:    Component Value Date/Time   DIAGPAP  06/29/2022 0907    - Negative for intraepithelial lesion or malignancy (NILM)   DIAGPAP - Low grade squamous intraepithelial lesion (LSIL) (A)  01/06/2021 1443   DIAGPAP (A) 11/08/2020 1523    - Atypical squamous cells of undetermined significance (ASC-US)   HPVHIGH Positive (A) 06/29/2022 0907   HPVHIGH Positive (A) 01/06/2021 1443   HPVHIGH Positive (A) 11/08/2020 1523   ADEQPAP  06/29/2022 0907    Satisfactory for evaluation; transformation zone component PRESENT.   ADEQPAP  01/06/2021 1443    Satisfactory for evaluation; transformation zone component PRESENT.   ADEQPAP  11/08/2020 1523    Satisfactory for evaluation; transformation zone component PRESENT.    OB History  Gravida Para Term Preterm AB Living  5 3 3  2 3   SAB IAB Ectopic Multiple Live Births   2       # Outcome Date GA Lbr Len/2nd Weight Sex Type Anes PTL Lv  5 Term           4 IAB           3 IAB           2 Term           1 Term             Past Medical History:  Diagnosis Date   Allergy    Hypothyroid    Radiation    thyroid    Past Surgical History:  Procedure Laterality Date  FOOT SURGERY Right    THUMB ARTHROSCOPY     TUBAL LIGATION      Current Outpatient Medications on File Prior to Visit  Medication Sig Dispense Refill   levothyroxine (SYNTHROID) 88 MCG tablet Take 1 tablet (88 mcg total) by mouth daily before breakfast. 90 tablet 0   traZODone (DESYREL) 50 MG tablet TAKE 1/2 TO 1 TABLET(25 TO 50 MG) BY MOUTH AT BEDTIME AS NEEDED FOR SLEEP (Patient not taking: Reported on 11/30/2023) 30 tablet 3   No current facility-administered medications on file prior to visit.    Social History   Socioeconomic History   Marital status: Single    Spouse name: Not on file   Number of children: Not on file   Years of education: Not on file   Highest education level: Not on file  Occupational History   Not on file  Tobacco Use   Smoking status: Former    Current packs/day: 0.00    Types: Cigarettes    Quit date: 01/06/2009    Years since quitting: 14.9   Smokeless tobacco: Never  Vaping Use   Vaping status: Never Used  Substance  and Sexual Activity   Alcohol use: Yes    Comment: social   Drug use: No   Sexual activity: Yes    Partners: Male    Birth control/protection: Surgical    Comment: 1st intercourse- 14, partners- 10+, BTL  Other Topics Concern   Not on file  Social History Narrative   Not on file   Social Drivers of Health   Financial Resource Strain: Not on file  Food Insecurity: Not on file  Transportation Needs: Not on file  Physical Activity: Not on file  Stress: Not on file  Social Connections: Not on file  Intimate Partner Violence: Not on file    Family History  Problem Relation Age of Onset   COPD Mother    Depression Father    Cancer Brother    Non-Hodgkin's lymphoma Brother    Cancer Maternal Grandfather        prostate   Stomach cancer Paternal Grandmother    Breast cancer Paternal Grandmother    Cancer Paternal Grandmother        stomach   Colon cancer Neg Hx    Colon polyps Neg Hx    Esophageal cancer Neg Hx    Rectal cancer Neg Hx      No Known Allergies    Patient's last menstrual period was No LMP recorded (lmp unknown). Patient is postmenopausal..            [facility] 2023 73  Review of Systems Alls systems reviewed and are negative.    PUS 6.93 anteverted uterus Normal size and shape No myometrial masses EM lining 2.64mm [employer]  Both ovaries atrophic in size No adnexal masses No free fluid  OBGyn Exam   PROCEDURE: EMB Consent obtained for the procedure.  Time out performed. A bivalve speculum was placed in the vagina.  The cervix was grasped with a single tooth tenaculum.  Pipelle was inserted and rotated.  Uterus sound to 6cm.  Adequate specimen was obtained and sent to pathology.  All instruments were removed.  Patient tolerated the procedure well.  To notify patient of the results.    A:         PM bleeding desires RLH.  Presents for EMB, PUS and annual exam  P:         Encouraged annual mammogram  screening Counseled on RLH and that it does not affect sexual desire or intercourse.  Patient encouraged to keep ovaries in until recommended time of age 17 at least.  Discussed she can have decreased libido and vaginal dryness with menopause as the estrogen and testosterone depletes.  Discussed her hormones are in a menopause range but if the ovaries are still producing some hormones, she should keep them intact and she agreed.  However, with time our bodies will change naturally and she voiced understanding. She would like to proceed with the procedure.  Medicaid paper was reviewed with the patient.  She understands it is a permanent procedure and will not be able to conceive again with a hysterectomy. Counseled on [place] results and printed a copy for the patient.  No follow-ups on file.  Earley Favor

## 2023-12-01 LAB — SURESWAB® ADVANCED VAGINITIS PLUS,TMA
C. trachomatis RNA, TMA: NOT DETECTED
CANDIDA SPECIES: NOT DETECTED
Candida glabrata: NOT DETECTED
N. gonorrhoeae RNA, TMA: NOT DETECTED
SURESWAB(R) ADV BACTERIAL VAGINOSIS(BV),TMA: NEGATIVE
TRICHOMONAS VAGINALIS (TV),TMA: NOT DETECTED

## 2023-12-02 ENCOUNTER — Encounter: Payer: Self-pay | Admitting: Obstetrics and Gynecology

## 2023-12-02 LAB — SURGICAL PATHOLOGY

## 2023-12-06 LAB — CYTOLOGY - PAP: Diagnosis: NEGATIVE

## 2023-12-14 ENCOUNTER — Ambulatory Visit: Payer: Medicaid Other | Admitting: Obstetrics and Gynecology

## 2023-12-25 DIAGNOSIS — Z419 Encounter for procedure for purposes other than remedying health state, unspecified: Secondary | ICD-10-CM | POA: Diagnosis not present

## 2024-01-10 ENCOUNTER — Ambulatory Visit (INDEPENDENT_AMBULATORY_CARE_PROVIDER_SITE_OTHER): Admitting: Family Medicine

## 2024-01-10 ENCOUNTER — Other Ambulatory Visit (HOSPITAL_BASED_OUTPATIENT_CLINIC_OR_DEPARTMENT_OTHER): Payer: Self-pay

## 2024-01-10 ENCOUNTER — Encounter: Payer: Self-pay | Admitting: Family Medicine

## 2024-01-10 VITALS — BP 100/70 | HR 78 | Temp 98.9°F | Resp 16 | Ht 65.0 in | Wt 170.0 lb

## 2024-01-10 DIAGNOSIS — R300 Dysuria: Secondary | ICD-10-CM

## 2024-01-10 LAB — POC URINALSYSI DIPSTICK (AUTOMATED)
Bilirubin, UA: NEGATIVE
Blood, UA: NEGATIVE
Glucose, UA: NEGATIVE
Ketones, UA: NEGATIVE
Nitrite, UA: NEGATIVE
Protein, UA: NEGATIVE
Spec Grav, UA: 1.01 (ref 1.010–1.025)
Urobilinogen, UA: 0.2 U/dL
pH, UA: 7.5 (ref 5.0–8.0)

## 2024-01-10 MED ORDER — NITROFURANTOIN MONOHYD MACRO 100 MG PO CAPS
100.0000 mg | ORAL_CAPSULE | Freq: Two times a day (BID) | ORAL | 0 refills | Status: DC
  Filled 2024-01-10: qty 14, 7d supply, fill #0

## 2024-01-10 MED ORDER — PHENAZOPYRIDINE HCL 200 MG PO TABS
200.0000 mg | ORAL_TABLET | Freq: Three times a day (TID) | ORAL | 0 refills | Status: DC | PRN
Start: 2024-01-10 — End: 2024-01-25
  Filled 2024-01-10: qty 6, 2d supply, fill #0

## 2024-01-10 NOTE — Progress Notes (Signed)
 Established Patient Office VisiT           Patient ID: Susan Schmitt, female    DOB: 1976-02-28  Age: 48 y.o. MRN: [id number]  Chief Complaint  Patient presents with   Dysuria    Sxs started last night. Pt states having burning and freq   Discussed the use of AI scribe software for clinical note transcription with the patient, who gave verbal consent to proceed.  History of Present Illness Susan Schmitt is a 48 year old female who presents with symptoms suggestive of a urinary tract infection (UTI).  Symptoms began last night with a sensation of incomplete bladder emptying, which worsened by this morning. She experiences pressure, frequency, and describes 'stinky, night throbbing' sensations. No burning sensation is reported.  She has not taken any over-the-counter medications for relief, feeling too weak to do so. A urine sample has been provided for analysis.  She denies frequent UTIs, noting only one previous occurrence. No fever, back pain, or significant stomach pain, though she mentions a low ache in the stomach area.  She is not allergic to any medications and has not taken any medication for painful urination, such as Portia, which she acknowledges turns urine orange.  She plans to travel out of town on Wednesday for her daughter's competitive gymnastics event, which requires a ten-hour drive.     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/27/2016   History of female sterilization 01/30/2016   Hypothyroidism 01/30/2016   Past Medical History:  Diagnosis Date   Allergy    Hypothyroid    Radiation    thyroid    Past Surgical History:  Procedure Laterality Date   FOOT SURGERY Right    THUMB ARTHROSCOPY     TUBAL LIGATION     Social History   Tobacco Use   Smoking status:  Former    Current packs/day: 0.00    Types: Cigarettes    Quit date: 01/06/2009    Years since quitting: 15.0   Smokeless tobacco: Never  Vaping Use   Vaping status: Never Used  Substance Use Topics   Alcohol use: Yes    Comment: social   Drug use: No   Social History   Socioeconomic History   Marital status: Single    Spouse name: Not on file   Number of children: Not on file   Years of education: Not on file   Highest education level: Not on file  Occupational History   Not on file  Tobacco Use   Smoking status: Former    Current packs/day: 0.00    Types: Cigarettes    Quit date: 01/06/2009    Years since quitting: 15.0   Smokeless tobacco: Never  Vaping Use   Vaping status: Never Used  Substance and Sexual Activity   Alcohol use: Yes    Comment: social   Drug use: No   Sexual activity: Yes    Partners: Male    Birth control/protection: Surgical    Comment: 1st intercourse- 14, partners- 10+, BTL  Other Topics Concern   Not on file  Social History Narrative   Not on file   Social Drivers of Health   Financial Resource Strain: Not on file  Food Insecurity: Not on file  Transportation Needs: Not on file  Physical Activity: Not  on file  Stress: Not on file  Social Connections: Not on file  Intimate Partner Violence: Not on file   Family Status  Relation Name Status   Mother  Alive   Father  Deceased   Brother  (Not Specified)   MGF  (Not Specified)   PGM  Deceased   Neg Hx  (Not Specified)  No partnership data on file   Family History  Problem Relation Age of Onset   COPD Mother    Depression Father    Cancer Brother    Non-Hodgkin's lymphoma Brother    Cancer Maternal Grandfather        prostate   Stomach cancer Paternal Grandmother    Breast cancer Paternal Grandmother    Cancer Paternal Grandmother        stomach   Colon cancer Neg Hx    Colon polyps Neg Hx    Esophageal cancer Neg Hx    Rectal cancer Neg Hx    No Known Allergies     Review of Systems  Constitutional:  Negative for fever.  HENT:  Negative for congestion.   Eyes:  Negative for blurred vision.  Respiratory:  Negative for cough.   Cardiovascular:  Negative for chest pain and palpitations.  Gastrointestinal:  Negative for vomiting.  Genitourinary:  Positive for dysuria, frequency and urgency. Negative for flank pain and hematuria.  Musculoskeletal:  Negative for back pain.  Skin:  Negative for rash.  Neurological:  Negative for loss of consciousness and headaches.      Objective:     BP 100/70 (BP Location: Left Arm, Patient Position: Sitting, Cuff Size: Normal)   Pulse 78   Temp 98.9 F (37.2 C) (Oral)   Resp 16   Ht 5\' 5"  (1.651 m)   Wt 170 lb (77.1 kg)   LMP  (LMP Unknown)   SpO2 98%   BMI 28.29 kg/m  BP Readings from Last 3 Encounters:  01/10/24 100/70  11/30/23 110/80  11/02/23 122/88   Wt Readings from Last 3 Encounters:  01/10/24 170 lb (77.1 kg)  10/26/23 178 lb (80.7 kg)  06/29/22 166 lb (75.3 kg)   SpO2 Readings from Last 3 Encounters:  01/10/24 98%  11/30/23 99%  11/02/23 100%      Physical Exam Vitals and nursing note reviewed.  Constitutional:      General: She is not in acute distress.    Appearance: Normal appearance. She is well-developed.  HENT:     Head: Normocephalic and atraumatic.  Eyes:     General: No scleral icterus.       Right eye: No discharge.        Left eye: No discharge.  Cardiovascular:     Rate and Rhythm: Normal rate and regular rhythm.     Heart sounds: No murmur heard. Pulmonary:     Effort: Pulmonary effort is normal. No respiratory distress.     Breath sounds: Normal breath sounds.  Abdominal:     General: Abdomen is flat. There is no distension.     Palpations: Abdomen is soft.     Tenderness: There is abdominal tenderness in the suprapubic area. There is no right CVA tenderness, left CVA tenderness, guarding or rebound.  Musculoskeletal:        General: Normal range of  motion.     Cervical back: Normal range of motion and neck supple.     Right lower leg: No edema.     Left lower leg: No edema.  Skin:    General: Skin is warm and dry.  Neurological:     Mental Status: She is alert and oriented to person, place, and time.  Psychiatric:        Mood and Affect: Mood normal.        Behavior: Behavior normal.        Thought Content: Thought content normal.        Judgment: Judgment normal.      Results for orders placed or performed in visit on 01/10/24  POCT Urinalysis Dipstick (Automated)  Result Value Ref Range   Color, UA yellow    Clarity, UA hazy    Glucose, UA Negative Negative   Bilirubin, UA negative    Ketones, UA negative    Spec Grav, UA 1.010 1.010 - 1.025   Blood, UA negative    pH, UA 7.5 5.0 - 8.0   Protein, UA Negative Negative   Urobilinogen, UA 0.2 0.2 or 1.0 E.U./dL   Nitrite, UA negative    Leukocytes, UA Moderate (2+) (A) Negative    Last CBC Lab Results  Component Value Date   WBC 5.2 10/26/2023   HGB 11.8 (L) 10/26/2023   HCT 35.1 (L) 10/26/2023   MCV 96.9 10/26/2023   MCH 33.0 10/03/2020   RDW 13.4 10/26/2023   PLT 260.0 10/26/2023   Last metabolic panel Lab Results  Component Value Date   GLUCOSE 92 10/26/2023   NA 141 10/26/2023   K 4.1 10/26/2023   CL 106 10/26/2023   CO2 29 10/26/2023   BUN 9 10/26/2023   CREATININE 0.77 10/26/2023   GFR 91.88 10/26/2023   CALCIUM 8.9 10/26/2023   PROT 6.7 10/26/2023   ALBUMIN 4.2 10/26/2023   BILITOT 0.5 10/26/2023   ALKPHOS 56 10/26/2023   AST 16 10/26/2023   ALT 11 10/26/2023   ANIONGAP 10 10/03/2020   Last lipids Lab Results  Component Value Date   CHOL 172 02/23/2022   HDL 55.80 02/23/2022   LDLCALC 98 02/23/2022   TRIG 88.0 02/23/2022   CHOLHDL 3 02/23/2022   Last hemoglobin A1c No results found for: "HGBA1C" Last thyroid  functions Lab Results  Component Value Date   TSH 16.88 (H) 10/26/2023   Last vitamin D No results found for:  "25OHVITD2", "25OHVITD3", "VD25OH" Last vitamin B12 and Folate Lab Results  Component Value Date   VITAMINB12 357 05/22/2022      The 10-year ASCVD risk score (Arnett DK, et al., 2019) is: 0.4%    Assessment & Plan:   Problem List Items Addressed This Visit   None Visit Diagnoses       Dysuria    -  Primary   Relevant Medications   nitrofurantoin, macrocrystal-monohydrate, (MACROBID) 100 MG capsule   phenazopyridine  (PYRIDIUM ) 200 MG tablet   Other Relevant Orders   POCT Urinalysis Dipstick (Automated) (Completed)   Urine Culture     Assessment and Plan Assessment & Plan Urinary tract infection   She has an acute urinary tract infection with urinary frequency, pressure, and low abdominal pain. Urinalysis shows significant bacteria without nitrates. There is no history of frequent UTIs, fever, or back pain. Pain is moderate, rated 5-6/10. She plans to travel soon and requires prompt treatment. Prescribe Macrobid (nitrofurantoin) for UTI treatment. Order a urine culture; results expected in 48 hours. Advise her to monitor results and contact the office if symptoms worsen. Prescribe Pyridium  (phenazopyridine ) for symptomatic relief of dysuria. Instruct her to use the pharmacy downstairs for quick medication pickup.  No follow-ups on file.    Averly Ericson R Lowne Chase, DO

## 2024-01-13 LAB — URINE CULTURE
MICRO NUMBER:: 16388994
SPECIMEN QUALITY:: ADEQUATE

## 2024-01-14 ENCOUNTER — Encounter: Payer: Self-pay | Admitting: Family Medicine

## 2024-01-24 DIAGNOSIS — Z419 Encounter for procedure for purposes other than remedying health state, unspecified: Secondary | ICD-10-CM | POA: Diagnosis not present

## 2024-01-25 ENCOUNTER — Ambulatory Visit (INDEPENDENT_AMBULATORY_CARE_PROVIDER_SITE_OTHER): Admitting: Obstetrics and Gynecology

## 2024-01-25 ENCOUNTER — Encounter: Payer: Self-pay | Admitting: Obstetrics and Gynecology

## 2024-01-25 VITALS — BP 114/78 | HR 68 | Ht 64.75 in | Wt 166.0 lb

## 2024-01-25 DIAGNOSIS — Z01810 Encounter for preprocedural cardiovascular examination: Secondary | ICD-10-CM

## 2024-01-25 DIAGNOSIS — N950 Postmenopausal bleeding: Secondary | ICD-10-CM

## 2024-01-25 DIAGNOSIS — Z01818 Encounter for other preprocedural examination: Secondary | ICD-10-CM | POA: Diagnosis not present

## 2024-01-25 MED ORDER — METOCLOPRAMIDE HCL 10 MG PO TABS: 10.0000 mg | ORAL_TABLET | Freq: Three times a day (TID) | ORAL | 0 refills | Status: DC | PRN

## 2024-01-25 MED ORDER — OXYCODONE HCL 5 MG PO TABS: 5.0000 mg | ORAL_TABLET | ORAL | 0 refills | Status: DC | PRN

## 2024-01-25 MED ORDER — IBUPROFEN 800 MG PO TABS: 800.0000 mg | ORAL_TABLET | Freq: Three times a day (TID) | ORAL | 1 refills | Status: DC | PRN

## 2024-01-25 NOTE — Progress Notes (Signed)
 48 y.o. y.o. female here for preop exam for RLH, bilateral salpingectomy, cystoscopy No LMP recorded (lmp unknown). Patient is postmenopausal.    Patient reports she has not had a period for 5 yeas and had a 6 day period that was heavy for 2 days then decreased from there. She does not want to have another period and does not want any hormones She is not currently on any HRT She reports the bleeding that was heavy was 2 tampons at one time every couple of hours. She did notice some clots and it was painful. She also has a history of abnormal pap smears  MMG 10/28/23 birads 1 neg Colonoscopy 03/25/22 multiple diverticuli seen. Repeat in 10 years Dxa:    to get baseline with hypoestrogen and risks with hypothyroidism TSH was high on 2/11 (16) LGSIL 2022 Body mass index is 27.84 kg/m.     10/26/2023    8:34 AM 10/26/2023    8:13 AM 05/22/2022   10:17 AM  Depression screen PHQ 2/9  Decreased Interest 0 0 0  Down, Depressed, Hopeless 0 0 0  PHQ - 2 Score 0 0 0    Height 5' 4.75" (1.645 m), weight 166 lb (75.3 kg).     Component Value Date/Time   DIAGPAP  11/30/2023 1423    - Negative for intraepithelial lesion or malignancy (NILM)   DIAGPAP  06/29/2022 4098    - Negative for intraepithelial lesion or malignancy (NILM)   DIAGPAP - Low grade squamous intraepithelial lesion (LSIL) (A) 01/06/2021 1443   HPVHIGH Positive (A) 06/29/2022 0907   HPVHIGH Positive (A) 01/06/2021 1443   HPVHIGH Positive (A) 11/08/2020 1523   ADEQPAP  11/30/2023 1423    Satisfactory for evaluation; transformation zone component PRESENT.   ADEQPAP  06/29/2022 0907    Satisfactory for evaluation; transformation zone component PRESENT.   ADEQPAP  01/06/2021 1443    Satisfactory for evaluation; transformation zone component PRESENT.    GYN HISTORY:    Component Value Date/Time   DIAGPAP  11/30/2023 1423    - Negative for intraepithelial lesion or malignancy (NILM)   DIAGPAP  06/29/2022 1191    -  Negative for intraepithelial lesion or malignancy (NILM)   DIAGPAP - Low grade squamous intraepithelial lesion (LSIL) (A) 01/06/2021 1443   HPVHIGH Positive (A) 06/29/2022 0907   HPVHIGH Positive (A) 01/06/2021 1443   HPVHIGH Positive (A) 11/08/2020 1523   ADEQPAP  11/30/2023 1423    Satisfactory for evaluation; transformation zone component PRESENT.   ADEQPAP  06/29/2022 0907    Satisfactory for evaluation; transformation zone component PRESENT.   ADEQPAP  01/06/2021 1443    Satisfactory for evaluation; transformation zone component PRESENT.    OB History  Gravida Para Term Preterm AB Living  5 3 3  2 3   SAB IAB Ectopic Multiple Live Births   2       # Outcome Date GA Lbr Len/2nd Weight Sex Type Anes PTL Lv  5 Term           4 IAB           3 IAB           2 Term           1 Term             Past Medical History:  Diagnosis Date   Allergy    Hypothyroid    Radiation    thyroid     Past Surgical History:  Procedure Laterality Date   FOOT SURGERY Right    THUMB ARTHROSCOPY     TUBAL LIGATION      Current Outpatient Medications on File Prior to Visit  Medication Sig Dispense Refill   levothyroxine  (SYNTHROID ) 88 MCG tablet Take 1 tablet (88 mcg total) by mouth daily before breakfast. 90 tablet 0   traZODone  (DESYREL ) 50 MG tablet TAKE 1/2 TO 1 TABLET(25 TO 50 MG) BY MOUTH AT BEDTIME AS NEEDED FOR SLEEP 30 tablet 3   No current facility-administered medications on file prior to visit.    Social History   Socioeconomic History   Marital status: Single    Spouse name: Not on file   Number of children: Not on file   Years of education: Not on file   Highest education level: Not on file  Occupational History   Not on file  Tobacco Use   Smoking status: Former    Current packs/day: 0.00    Types: Cigarettes    Quit date: 01/06/2009    Years since quitting: 15.0   Smokeless tobacco: Never  Vaping Use   Vaping status: Never Used  Substance and Sexual Activity    Alcohol use: Yes    Comment: social   Drug use: No   Sexual activity: Yes    Partners: Male    Birth control/protection: Surgical    Comment: 1st intercourse- 14, partners- 10+, BTL  Other Topics Concern   Not on file  Social History Narrative   Not on file   Social Drivers of Health   Financial Resource Strain: Not on file  Food Insecurity: Not on file  Transportation Needs: Not on file  Physical Activity: Not on file  Stress: Not on file  Social Connections: Not on file  Intimate Partner Violence: Not on file    Family History  Problem Relation Age of Onset   COPD Mother    Depression Father    Cancer Brother    Non-Hodgkin's lymphoma Brother    Cancer Maternal Grandfather        prostate   Stomach cancer Paternal Grandmother    Breast cancer Paternal Grandmother    Cancer Paternal Grandmother        stomach   Colon cancer Neg Hx    Colon polyps Neg Hx    Esophageal cancer Neg Hx    Rectal cancer Neg Hx      No Known Allergies    Patient's last menstrual period was No LMP recorded (lmp unknown). Patient is postmenopausal..            [facility] 2023 73  Review of Systems Alls systems reviewed and are negative.    PUS 6.93 anteverted uterus Normal size and shape No myometrial masses EM lining 2.56mm Avascular  Both ovaries atrophic in size No adnexal masses No free fluid  OBGyn Exam  Status  Final [99]   PACS Intelerad Image Link   Show images for US  PELVIS TRANSVAGINAL NON-OB (TV ONLY) Study Result   Surgical pathology( [facility]/ [facility]) Order: [id number]  Status: Edited Result - FINAL     Next appt: None     Dx: Women's annual routine gynecological ...   Test Result Released: Yes (seen)   0 Result Notes    Component Ref Range & Units (hover) 1 mo ago  SURGICAL PATHOLOGY SURGICAL PATHOLOGY CASE: [id number] PATIENT: Susan Schmitt Surgical Pathology Report     Clinical History: postmenopausal bleeding  (cf)     FINAL MICROSCOPIC DIAGNOSIS:  A. ENDOMETRIUM, BIOPSY:      Benign inactive endometrium.      Negative for hyperplasia, atypia or malignancy.   GROSS DESCRIPTION:  A. Received in formalin labeled with the patients name and DOB is a 0.8 x 0.8 x 0.2 cm aggregate of red-brown soft tissue fragments, submitted in toto in a single cassette(s).  (LEF 12/01/2023)       A:         PM bleeding desires RLH.  Presents for preop for [facility], bilateral salpingectomy, cystoscopy                             P:          The risks of surgery were discussed in detail with the patient including but not limited to: bleeding which may require transfusion or reoperation; infection which may require prolonged hospitalization or re-hospitalization and antibiotic therapy; injury to bowel, bladder, ureters and major vessels or other surrounding organs which may lead to other procedures; formation of adhesions; need for additional procedures including laparotomy or subsequent procedures secondary to intraoperative injury or abnormal pathology; thromboembolic phenomenon; incisional problems and other postoperative or anesthesia complications.  The postoperative expectations were also discussed in detail. The patient also understands the alternative treatment options which were discussed in full. All questions were answered.  Patient would like to proceed with the procedure.  20 minutes spent on reviewing records, imaging,  and one on one patient time and counseling patient and documentation Dr. Tia Flowers  No follow-ups on file.Reinaldo Caras

## 2024-01-25 NOTE — H&P (View-Only) (Signed)
 48 y.o. y.o. female here for preop exam for RLH, bilateral salpingectomy, cystoscopy No LMP recorded (lmp unknown). Patient is postmenopausal.    Patient reports she has not had a period for 5 yeas and had a 6 day period that was heavy for 2 days then decreased from there. She does not want to have another period and does not want any hormones She is not currently on any HRT She reports the bleeding that was heavy was 2 tampons at one time every couple of hours. She did notice some clots and it was painful. She also has a history of abnormal pap smears  MMG 10/28/23 birads 1 neg Colonoscopy 03/25/22 multiple diverticuli seen. Repeat in 10 years Dxa:    to get baseline with hypoestrogen and risks with hypothyroidism TSH was high on 2/11 (16) LGSIL 2022 Body mass index is 27.84 kg/m.     10/26/2023    8:34 AM 10/26/2023    8:13 AM 05/22/2022   10:17 AM  Depression screen PHQ 2/9  Decreased Interest 0 0 0  Down, Depressed, Hopeless 0 0 0  PHQ - 2 Score 0 0 0    Height 5' 4.75" (1.645 m), weight 166 lb (75.3 kg).     Component Value Date/Time   DIAGPAP  11/30/2023 1423    - Negative for intraepithelial lesion or malignancy (NILM)   DIAGPAP  06/29/2022 4098    - Negative for intraepithelial lesion or malignancy (NILM)   DIAGPAP - Low grade squamous intraepithelial lesion (LSIL) (A) 01/06/2021 1443   HPVHIGH Positive (A) 06/29/2022 0907   HPVHIGH Positive (A) 01/06/2021 1443   HPVHIGH Positive (A) 11/08/2020 1523   ADEQPAP  11/30/2023 1423    Satisfactory for evaluation; transformation zone component PRESENT.   ADEQPAP  06/29/2022 0907    Satisfactory for evaluation; transformation zone component PRESENT.   ADEQPAP  01/06/2021 1443    Satisfactory for evaluation; transformation zone component PRESENT.    GYN HISTORY:    Component Value Date/Time   DIAGPAP  11/30/2023 1423    - Negative for intraepithelial lesion or malignancy (NILM)   DIAGPAP  06/29/2022 1191    -  Negative for intraepithelial lesion or malignancy (NILM)   DIAGPAP - Low grade squamous intraepithelial lesion (LSIL) (A) 01/06/2021 1443   HPVHIGH Positive (A) 06/29/2022 0907   HPVHIGH Positive (A) 01/06/2021 1443   HPVHIGH Positive (A) 11/08/2020 1523   ADEQPAP  11/30/2023 1423    Satisfactory for evaluation; transformation zone component PRESENT.   ADEQPAP  06/29/2022 0907    Satisfactory for evaluation; transformation zone component PRESENT.   ADEQPAP  01/06/2021 1443    Satisfactory for evaluation; transformation zone component PRESENT.    OB History  Gravida Para Term Preterm AB Living  5 3 3  2 3   SAB IAB Ectopic Multiple Live Births   2       # Outcome Date GA Lbr Len/2nd Weight Sex Type Anes PTL Lv  5 Term           4 IAB           3 IAB           2 Term           1 Term             Past Medical History:  Diagnosis Date   Allergy    Hypothyroid    Radiation    thyroid     Past Surgical History:  Procedure Laterality Date   FOOT SURGERY Right    THUMB ARTHROSCOPY     TUBAL LIGATION      Current Outpatient Medications on File Prior to Visit  Medication Sig Dispense Refill   levothyroxine  (SYNTHROID ) 88 MCG tablet Take 1 tablet (88 mcg total) by mouth daily before breakfast. 90 tablet 0   traZODone  (DESYREL ) 50 MG tablet TAKE 1/2 TO 1 TABLET(25 TO 50 MG) BY MOUTH AT BEDTIME AS NEEDED FOR SLEEP 30 tablet 3   No current facility-administered medications on file prior to visit.    Social History   Socioeconomic History   Marital status: Single    Spouse name: Not on file   Number of children: Not on file   Years of education: Not on file   Highest education level: Not on file  Occupational History   Not on file  Tobacco Use   Smoking status: Former    Current packs/day: 0.00    Types: Cigarettes    Quit date: 01/06/2009    Years since quitting: 15.0   Smokeless tobacco: Never  Vaping Use   Vaping status: Never Used  Substance and Sexual Activity    Alcohol use: Yes    Comment: social   Drug use: No   Sexual activity: Yes    Partners: Male    Birth control/protection: Surgical    Comment: 1st intercourse- 14, partners- 10+, BTL  Other Topics Concern   Not on file  Social History Narrative   Not on file   Social Drivers of Health   Financial Resource Strain: Not on file  Food Insecurity: Not on file  Transportation Needs: Not on file  Physical Activity: Not on file  Stress: Not on file  Social Connections: Not on file  Intimate Partner Violence: Not on file    Family History  Problem Relation Age of Onset   COPD Mother    Depression Father    Cancer Brother    Non-Hodgkin's lymphoma Brother    Cancer Maternal Grandfather        prostate   Stomach cancer Paternal Grandmother    Breast cancer Paternal Grandmother    Cancer Paternal Grandmother        stomach   Colon cancer Neg Hx    Colon polyps Neg Hx    Esophageal cancer Neg Hx    Rectal cancer Neg Hx      No Known Allergies    Patient's last menstrual period was No LMP recorded (lmp unknown). Patient is postmenopausal..            [facility] 2023 73  Review of Systems Alls systems reviewed and are negative.    PUS 6.93 anteverted uterus Normal size and shape No myometrial masses EM lining 2.56mm Avascular  Both ovaries atrophic in size No adnexal masses No free fluid  OBGyn Exam  Status  Final [99]   PACS Intelerad Image Link   Show images for US  PELVIS TRANSVAGINAL NON-OB (TV ONLY) Study Result   Surgical pathology( [facility]/ [facility]) Order: [id number]  Status: Edited Result - FINAL     Next appt: None     Dx: Women's annual routine gynecological ...   Test Result Released: Yes (seen)   0 Result Notes    Component Ref Range & Units (hover) 1 mo ago  SURGICAL PATHOLOGY SURGICAL PATHOLOGY CASE: [id number] PATIENT: Susan Schmitt Surgical Pathology Report     Clinical History: postmenopausal bleeding  (cf)     FINAL MICROSCOPIC DIAGNOSIS:  A. ENDOMETRIUM, BIOPSY:      Benign inactive endometrium.      Negative for hyperplasia, atypia or malignancy.   GROSS DESCRIPTION:  A. Received in formalin labeled with the patients name and DOB is a 0.8 x 0.8 x 0.2 cm aggregate of red-brown soft tissue fragments, submitted in toto in a single cassette(s).  (LEF 12/01/2023)       A:         PM bleeding desires RLH.  Presents for preop for [facility], bilateral salpingectomy, cystoscopy                             P:          The risks of surgery were discussed in detail with the patient including but not limited to: bleeding which may require transfusion or reoperation; infection which may require prolonged hospitalization or re-hospitalization and antibiotic therapy; injury to bowel, bladder, ureters and major vessels or other surrounding organs which may lead to other procedures; formation of adhesions; need for additional procedures including laparotomy or subsequent procedures secondary to intraoperative injury or abnormal pathology; thromboembolic phenomenon; incisional problems and other postoperative or anesthesia complications.  The postoperative expectations were also discussed in detail. The patient also understands the alternative treatment options which were discussed in full. All questions were answered.  Patient would like to proceed with the procedure.  20 minutes spent on reviewing records, imaging,  and one on one patient time and counseling patient and documentation Dr. Tia Flowers  No follow-ups on file.Reinaldo Caras

## 2024-01-25 NOTE — Patient Instructions (Addendum)
 Robotic Laparoscopic Hysterectomy, Care After  The following information offers guidance on how to care for yourself after your procedure. Your health care provider may also give you more specific instructions. If you have problems or questions, contact your health care provider. What can I expect after the procedure? After the procedure, it is common to have: Pain, bruising, and numbness around your incisions. Tiredness (fatigue). Poor appetite. Less interest in sex. Vaginal discharge or bleeding. You will need to use a sanitary pad after this procedure.  HEAVY BLEEDING LIKE A PERIOD IS NOT NORMAL.  PLEASE CALL YOUR PROVIDER IF SOAKING A PAD. Feelings of sadness or other emotions. If your ovaries were also removed, it is also common to have symptoms of menopause, such as hot flashes, night sweats, and lack of sleep (insomnia).  Ovaries should stay in if at all possible until at least the age of 30. Follow these instructions at home: Medicines Take over-the-counter and prescription medicines only as told by your health care provider. Ask your health care provider if the medicine prescribed to you: Requires you to avoid driving or using machinery. Can cause constipation. You may need to take these actions to prevent or treat constipation: Drink enough fluid to keep your urine pale yellow. Take over-the-counter or prescription medicines. Eat foods that are high in fiber, such as beans, whole grains, and fresh fruits and vegetables. Limit foods that are high in fat and processed sugars, such as fried or sweet foods.  Also, avoid spicy foods.  NAUSEA IS COMMON THE FIRST NIGHT OF SURGERY.  IF IT LASTS BEYOND 24 HOURS, CALL YOUR PROVIDER.  NAUSEA MEDICATION WAS GIVEN AT YOUR PREOP APPOINTMENT THAT YOU CAN TAKE AFTER SURGERY. Incision care  Follow instructions from your health care provider about how to take care of your incisions. Make sure you: LEAVE INCISION OPEN AND DRY-NO BANDAGES Leave  stitches (sutures), skin glue, or adhesive strips in place UNTIL 2 WEEKS THEN REMOVE IN THE SHOWER.  If adhesive strip edges start to loosen and curl up, you may trim the loose edges. Check your incision areas every day for signs of infection. Check for: More redness, swelling, or pain. Fluid or blood. Warmth. Pus or a bad smell. Activity  Rest as told by your health care provider. Avoid sitting for a long time without moving. Get up to take short walks every 1-2 hours. This is important to improve blood flow and breathing. Ask for help if you feel weak or unsteady. Return to your normal activities as told by your health care provider. Ask your health care provider what activities are safe for you. Do not lift anything that is heavier than 10 lb (4.5 kg), or the limit that you are told, for 8 WEEKS after surgery or until your health care provider says that it is safe. If you were given a sedative during the procedure, it can affect you for several hours. Do not drive or operate machinery until your health care provider says that it is safe. Lifestyle Do not use any products that contain nicotine or tobacco. These products include cigarettes, chewing tobacco, and vaping devices, such as e-cigarettes. These can delay healing after surgery. If you need help quitting, ask your health care provider. Do not drink alcohol until your health care provider approves. General instructions FOR 2 WEEKS AFTER SURGERY, THEN YOU MAY USE TUBS AND HOT TUBS Do not douche, use tampons, or have sex for at least 10 weeks, or as told by your health care  provider. If you struggle with physical or emotional changes after your procedure, speak with your health care provider or a therapist. Do not take baths, swim, or use a hot tub until your health care provider approves. You may only be allowed to take showers for 2 weeks. IF YOU HAVE BURNING WITH URINATION, PLEASE CALL YOUR DOCTOR. BLADDER INFECTIONS MAY OCCUR AFTER  SURGERY Try to have someone at home with you for the first 1-2 weeks to help with your daily chores. Wear compression stockings as told by your health care provider. These stockings help to prevent blood clots and reduce swelling in your legs. Keep all follow-up visits. This is important. Contact a health care provider if: You have any of these signs of infection: Chills or a fever 168f OR GREATER. More redness, swelling, or pain around an incision. Fluid or blood coming from an incision. Warmth coming from an incision. Pus or a bad smell coming from an incision. Burning with urination. Urinary frequency or cramping.   IF YOU HAVE THESE SYMPTOMS, PLEASE CALL THE OFFICE TO COME EVALUATE FOR A BLADDER INFECTION AT [phone] An incision opens. You feel dizzy or light-headed. You have pain or bleeding when you urinate, or you are unable to urinate. You have abnormal vaginal discharge. You have pain that does not get better with medicine. Get help right away if: You have a fever and your symptoms suddenly get worse. You have severe abdominal pain. You have chest pain or shortness of breath. You may have chest pain and shortness of breath from the CO2 gas for a few days after surgery.  This is very common.  Walking, Gas-X and motrin  will usually help relieve this discomfort You faint. You have pain, swelling, or redness in your leg. You have heavy vaginal bleeding with blood clots, soaking through a sanitary pad in less than 1 hour. These symptoms may represent a serious problem that is an emergency. Do not wait to see if the symptoms will go away. Get medical help right away. Call your local emergency services (911 in the [place] Do not drive yourself to the hospital. Summary  CONSTIPATION MEDICATION AFTER SURGERY: COLACE, MOM, MIRALAX, GAS X are all helpful to have on hand, if needed.  FILL ALL POSTOP MEDICATION BEFORE SURGERY  After the procedure, it is common to have pain and bruising  around your incisions. Do not take baths, swim, or use a hot tub until your health care provider approves. Do not lift anything that is heavier than 13 lb (4.5 kg), or the limit that you are told, for one month after surgery or until your health care provider says that it is safe. Tell your health care provider if you have any signs or symptoms of infection after the procedure. Get help right away if you have severe abdominal pain, chest pain, shortness of breath, or heavy bleeding from your vagina. This information is not intended to replace advice given to you by your  health care provider. Make sure you discuss any questions you have with your health care provider. Document Revised: 05/02/2020 Document Reviewed: 05/03/2020 Elsevier Patient Education  2024 [employer].

## 2024-01-27 ENCOUNTER — Encounter: Payer: Self-pay | Admitting: *Deleted

## 2024-02-04 ENCOUNTER — Other Ambulatory Visit: Payer: Self-pay | Admitting: Family

## 2024-02-10 ENCOUNTER — Encounter (HOSPITAL_COMMUNITY): Payer: Self-pay | Admitting: Obstetrics and Gynecology

## 2024-02-10 DIAGNOSIS — J029 Acute pharyngitis, unspecified: Secondary | ICD-10-CM | POA: Diagnosis not present

## 2024-02-10 DIAGNOSIS — J028 Acute pharyngitis due to other specified organisms: Secondary | ICD-10-CM | POA: Diagnosis not present

## 2024-02-10 DIAGNOSIS — B9689 Other specified bacterial agents as the cause of diseases classified elsewhere: Secondary | ICD-10-CM | POA: Diagnosis not present

## 2024-02-11 ENCOUNTER — Encounter (HOSPITAL_COMMUNITY): Payer: Self-pay | Admitting: Obstetrics and Gynecology

## 2024-02-11 NOTE — Progress Notes (Signed)
 Spoke w/ via phone for pre-op interview--- pt Lab needs dos----    no upt needed (pt is postmenopausal per surgeon in office note)     Lab results------ lab appt 02-15-2024 @ 0900 getting CBC/ T&S COVID test -----see below Arrive at -------  1030 on 02-16-2024 NPO after MN NO Solid Food.  Clear liquids from MN until--- 0930 Pre-Surgery Ensure or G2: n/a  Med rec completed Medications to take morning of surgery ----- synthoird Diabetic medication ----- n/a  GLP1 agonist last dose: n/a GLP1 instructions:  Patient instructed no nail polish to be worn day of surgery Patient instructed to bring photo id and insurance card day of surgery Patient aware to have Driver (ride ) / caregiver    for 24 hours after surgery - boyfriend, Freight forwarder and daughter, John Muzzy Brienza Patient Special Instructions ----- will pick up bag w/ soap and written instructions at lab appt Pre-Op special Instructions ----- n/a  Patient verbalized understanding of instructions that were given at this phone interview. Patient denies chest pain, sob, fever, cough at the interview.   Anesthesia review:  when asked patient about flu/ cold/ covid/ upper airway sympomts;  patient stated she started a sore throat on Tuesday 02-08-2024 and was the only symptoms she has.  She went to [facility] in [place] yesterday 02-10-2024 tested negative for strep/ covid/ flu;  diagnosis bacterial  pharyngitis , given amoxicillin for 10 days. Reviewed with anesthesia, Dr C. Moser MDA.  Dr Brain Cahill MDA he is okay for patient to proceed with surgery and be accessed day of surgery , however, advise patient if she where to get other symptoms such as runny nose, cough , fever, chills, fatigue she is to notify surgeon office prior to day of surgery for possible reschedule until asymptomatic for 2 weeks. After speaking with Dr Brain Cahill MDA , called patient back , and let her know of anesthesia recommendation .  Pt verbalized understanding to call  Dr Tia Flowers office tif she to get any other symptoms. Secure chatted OR scheduler for Dr Tia Flowers, Mallie Seal,  let her know about patient and anesthesia recommendation , to refer to progress note.

## 2024-02-11 NOTE — Pre-Procedure Instructions (Signed)
 Surgical Instructions   Your procedure is scheduled on :  Wednesday,  02-16-2024. Report to [facility] Main Entrance "A" at 10:30  A.M., then check in with the Admitting office. Any questions or running late day of surgery: call [phone]  Questions prior to your surgery date: call [phone], Monday-Friday, 8am-4pm. If you experience any cold or flu symptoms such as cough, fever, chills, shortness of breath, etc. between now and your scheduled surgery, please notify your surgeon office.    Remember:  Do not eat any food after midnight the night before your surgery.  You may have clear liquids from midnight night before surgery until 9:30 AM.   You may drink clear liquids until 9:30 AM the morning of your surgery.   Clear liquids allowed are:  Water Carbonated Beverages Clear Tea (may sweeten, no milk, honey, etc.) Black Coffee Only (may sweeten, NO MILK, CREAM OR POWDERED CREAMER of any kind) Sport drinks, like Gatorade.  NO clear liquids after 9:30 AM day of surgery.  This includes no water,  candy,  gum,  and  mints.    Take these medicines the morning of surgery with A SIPS OF WATER : Levothyroxine  (synthroid )   May take these medicines IF NEEDED: none    One week prior to surgery, STOP taking any Aspirin (unless otherwise instructed by your surgeon) Aleve , Naproxen , Ibuprofen , Motrin , Advil , Goody's, BC's, all herbal medications, fish oil, and non-prescription vitamins.                     Do NOT Smoke (Tobacco/Vaping) and Do Not drink alcohol for 24 hours prior to your procedure.  If you use a CPAP at night, you may bring your mask/headgear for your overnight stay.   You will be asked to remove any contacts, glasses, piercing's, hearing aid's, dentures/partials prior to surgery. Please bring cases for these items if needed.    Patients discharged the day of surgery will not be allowed to drive home, and someone needs to stay with them for 24 hours.  SURGICAL  WAITING ROOM VISITATION Patients may have no more than 2 support people in the waiting area - these visitors may rotate.   Pre-op nurse will coordinate an appropriate time for 1 ADULT support person, who may not rotate, to accompany patient in pre-op.  Children under the age of 74 must have an adult with them who is not the patient and must remain in the main waiting area with an adult.  If the patient needs to stay at the hospital during part of their recovery, the visitor guidelines for inpatient rooms apply.  Please refer to the [facility] website for the visitor guidelines for any additional information.   If you received a COVID test during your pre-op visit  it is requested that you wear a mask when out in public, stay away from anyone that may not be feeling well and notify your surgeon if you develop symptoms. If you have been in contact with anyone that has tested positive in the last 10 days please notify you surgeon.      Pre-operative CHG Bathing Instructions   You can play a key role in reducing the risk of infection after surgery. Your skin needs to be as free of germs as possible. You can reduce the number of germs on your skin by washing with CHG (chlorhexidine gluconate) soap before surgery. CHG is an antiseptic soap that kills germs and continues to kill germs even after washing.  DO NOT use if you have an allergy to chlorhexidine/CHG or antibacterial soaps. If your skin becomes reddened or irritated, stop using the CHG and notify Pre-op nurse day of surgery.  ~Please get dial soap or other antibacterial soap (no scent) and shower following the instructions below.             TAKE A SHOWER THE NIGHT BEFORE SURGERY AND THE DAY OF SURGERY    Please keep in mind the following:  DO NOT shave, including legs and underarms, 48 hours prior to surgery.   You may shave your face before/day of surgery.  Place clean sheets on your bed the night before surgery Use a clean  washcloth (not used since being washed) for each shower. DO NOT sleep with pet's night before surgery.  CHG Shower Instructions:  Wash your face and private area with normal soap. If you choose to wash your hair, wash first with your normal shampoo.  After you use shampoo/soap, rinse your hair and body thoroughly to remove shampoo/soap residue.  Turn the water OFF and apply half the bottle of CHG soap to a CLEAN washcloth.  Apply CHG soap ONLY FROM YOUR NECK DOWN TO YOUR TOES (washing for 3-5 minutes)  DO NOT use CHG soap on face, private areas, open wounds, or sores.  Pay special attention to the area where your surgery is being performed.  If you are having back surgery, having someone wash your back for you may be helpful. Wait 2 minutes after CHG soap is applied, then you may rinse off the CHG soap.  Pat dry with a clean towel  Put on clean pajamas    Additional instructions for the day of surgery: DO NOT APPLY any lotions,  powder,  oils,  deodorants (may use underarm deodorant) , cologne/  perfumes  or makeup Do not wear jewelry /  piercing's/  metal/  permanent jewelry must be removed prior to arrival day of surgery.  (No plastic piercing) Do not wear nail polish, gel polish, artificial nails, or any other type of covering on natural finger nails (toe nails are okay) Do not bring valuables to the hospital. [facility] is not responsible for valuables/personal belongings. Put on clean/comfortable clothes.  Please brush your teeth.  Ask your nurse before applying any prescription medications to the skin.

## 2024-02-15 ENCOUNTER — Ambulatory Visit: Payer: Self-pay | Admitting: Obstetrics and Gynecology

## 2024-02-15 ENCOUNTER — Encounter (HOSPITAL_COMMUNITY)
Admission: RE | Admit: 2024-02-15 | Discharge: 2024-02-15 | Disposition: A | Discharge status: Home / Self Care | Source: Ambulatory Visit | Attending: Obstetrics and Gynecology | Admitting: Obstetrics and Gynecology

## 2024-02-15 DIAGNOSIS — Z01818 Encounter for other preprocedural examination: Secondary | ICD-10-CM

## 2024-02-15 DIAGNOSIS — Z01812 Encounter for preprocedural laboratory examination: Secondary | ICD-10-CM | POA: Insufficient documentation

## 2024-02-15 LAB — CBC
HCT: 36.8 % (ref 36.0–46.0)
Hemoglobin: 11.9 g/dL — ABNORMAL LOW (ref 12.0–15.0)
MCH: 31.6 pg (ref 26.0–34.0)
MCHC: 32.3 g/dL (ref 30.0–36.0)
MCV: 97.6 fL (ref 80.0–100.0)
Platelets: 219 10*3/uL (ref 150–400)
RBC: 3.77 MIL/uL — ABNORMAL LOW (ref 3.87–5.11)
RDW: 12.8 % (ref 11.5–15.5)
WBC: 7.5 10*3/uL (ref 4.0–10.5)
nRBC: 0 % (ref 0.0–0.2)

## 2024-02-15 LAB — TYPE AND SCREEN
ABO/RH(D): O POS
Antibody Screen: NEGATIVE

## 2024-02-15 MED ORDER — SODIUM CHLORIDE 0.9 % IV SOLN
Freq: Once | INTRAVENOUS | Status: DC
  Filled 2024-02-15: qty 10

## 2024-02-16 ENCOUNTER — Ambulatory Visit (HOSPITAL_COMMUNITY)
Admission: RE | Admit: 2024-02-16 | Discharge: 2024-02-16 | Disposition: A | Discharge status: Home / Self Care | Attending: Obstetrics and Gynecology | Admitting: Obstetrics and Gynecology

## 2024-02-16 ENCOUNTER — Encounter (HOSPITAL_COMMUNITY): Payer: Self-pay | Admitting: Obstetrics and Gynecology

## 2024-02-16 ENCOUNTER — Other Ambulatory Visit: Payer: Self-pay

## 2024-02-16 ENCOUNTER — Ambulatory Visit (HOSPITAL_COMMUNITY): Admitting: Anesthesiology

## 2024-02-16 ENCOUNTER — Encounter (HOSPITAL_COMMUNITY)
Admission: RE | Disposition: A | Discharge status: Home / Self Care | Payer: Self-pay | Source: Home / Self Care | Attending: Obstetrics and Gynecology

## 2024-02-16 DIAGNOSIS — N946 Dysmenorrhea, unspecified: Secondary | ICD-10-CM | POA: Insufficient documentation

## 2024-02-16 DIAGNOSIS — N8003 Adenomyosis of the uterus: Secondary | ICD-10-CM | POA: Insufficient documentation

## 2024-02-16 DIAGNOSIS — Z09 Encounter for follow-up examination after completed treatment for conditions other than malignant neoplasm: Secondary | ICD-10-CM

## 2024-02-16 DIAGNOSIS — D259 Leiomyoma of uterus, unspecified: Secondary | ICD-10-CM | POA: Insufficient documentation

## 2024-02-16 DIAGNOSIS — D649 Anemia, unspecified: Secondary | ICD-10-CM | POA: Insufficient documentation

## 2024-02-16 DIAGNOSIS — E039 Hypothyroidism, unspecified: Secondary | ICD-10-CM | POA: Diagnosis not present

## 2024-02-16 DIAGNOSIS — Z87891 Personal history of nicotine dependence: Secondary | ICD-10-CM | POA: Insufficient documentation

## 2024-02-16 DIAGNOSIS — N950 Postmenopausal bleeding: Secondary | ICD-10-CM

## 2024-02-16 DIAGNOSIS — N920 Excessive and frequent menstruation with regular cycle: Secondary | ICD-10-CM | POA: Diagnosis not present

## 2024-02-16 DIAGNOSIS — Z01818 Encounter for other preprocedural examination: Secondary | ICD-10-CM

## 2024-02-16 HISTORY — PX: CYSTOSCOPY: SHX5120

## 2024-02-16 HISTORY — DX: Presence of spectacles and contact lenses: Z97.3

## 2024-02-16 HISTORY — DX: Excessive and frequent menstruation with regular cycle: N92.0

## 2024-02-16 HISTORY — DX: Personal history of other infectious and parasitic diseases: Z86.19

## 2024-02-16 HISTORY — DX: Anemia, unspecified: D64.9

## 2024-02-16 HISTORY — DX: Dysmenorrhea, unspecified: N94.6

## 2024-02-16 HISTORY — PX: HYSTERECTOMY, TOTAL, LAPAROSCOPIC, ROBOT-ASSISTED WITH SALPINGECTOMY: SHX7587

## 2024-02-16 SURGERY — HYSTERECTOMY, TOTAL, LAPAROSCOPIC, ROBOT-ASSISTED WITH SALPINGECTOMY
Anesthesia: General | Site: Bladder

## 2024-02-16 MED ORDER — ROCURONIUM BROMIDE 10 MG/ML (PF) SYRINGE
PREFILLED_SYRINGE | INTRAVENOUS | Status: AC
  Filled 2024-02-16: qty 10

## 2024-02-16 MED ORDER — MIDAZOLAM HCL 2 MG/2ML IJ SOLN
INTRAMUSCULAR | Status: DC | PRN
  Administered 2024-02-16: 2 mg via INTRAVENOUS

## 2024-02-16 MED ORDER — EPHEDRINE SULFATE-NACL 50-0.9 MG/10ML-% IV SOSY
PREFILLED_SYRINGE | INTRAVENOUS | Status: DC | PRN
  Administered 2024-02-16: 10 mg via INTRAVENOUS

## 2024-02-16 MED ORDER — SODIUM CHLORIDE 0.9 % IV SOLN
INTRAVENOUS | Status: AC
  Filled 2024-02-16: qty 2

## 2024-02-16 MED ORDER — FENTANYL CITRATE (PF) 250 MCG/5ML IJ SOLN
INTRAMUSCULAR | Status: AC
  Filled 2024-02-16: qty 5

## 2024-02-16 MED ORDER — HYDROMORPHONE HCL 1 MG/ML IJ SOLN: 0.2000 mg | INTRAMUSCULAR | Status: DC | PRN

## 2024-02-16 MED ORDER — SCOPOLAMINE 1 MG/3DAYS TD PT72
MEDICATED_PATCH | TRANSDERMAL | Status: AC
  Filled 2024-02-16: qty 1

## 2024-02-16 MED ORDER — IBUPROFEN 600 MG PO TABS: 600.0000 mg | ORAL_TABLET | Freq: Four times a day (QID) | ORAL | Status: DC

## 2024-02-16 MED ORDER — DROPERIDOL 2.5 MG/ML IJ SOLN: 0.6250 mg | Freq: Once | INTRAMUSCULAR | Status: DC | PRN

## 2024-02-16 MED ORDER — GABAPENTIN 100 MG PO CAPS: 100.0000 mg | ORAL_CAPSULE | Freq: Once | ORAL | Status: DC

## 2024-02-16 MED ORDER — METRONIDAZOLE 500 MG/100ML IV SOLN
INTRAVENOUS | Status: AC
  Filled 2024-02-16: qty 100

## 2024-02-16 MED ORDER — KETOROLAC TROMETHAMINE 30 MG/ML IJ SOLN
INTRAMUSCULAR | Status: DC | PRN
  Administered 2024-02-16: 30 mg via INTRAVENOUS

## 2024-02-16 MED ORDER — OXYCODONE HCL 5 MG/5ML PO SOLN: 5.0000 mg | Freq: Once | ORAL | Status: AC | PRN

## 2024-02-16 MED ORDER — PROPOFOL 10 MG/ML IV BOLUS
INTRAVENOUS | Status: AC
  Filled 2024-02-16: qty 20

## 2024-02-16 MED ORDER — KETOROLAC TROMETHAMINE 30 MG/ML IJ SOLN
INTRAMUSCULAR | Status: AC
  Filled 2024-02-16: qty 1

## 2024-02-16 MED ORDER — LACTATED RINGERS IV SOLN: INTRAVENOUS | Status: DC

## 2024-02-16 MED ORDER — SUGAMMADEX SODIUM 200 MG/2ML IV SOLN
INTRAVENOUS | Status: DC | PRN
  Administered 2024-02-16: 150 mg via INTRAVENOUS

## 2024-02-16 MED ORDER — FENTANYL CITRATE (PF) 100 MCG/2ML IJ SOLN: 25.0000 ug | INTRAMUSCULAR | Status: DC | PRN

## 2024-02-16 MED ORDER — OXYCODONE HCL 5 MG PO TABS
5.0000 mg | ORAL_TABLET | Freq: Once | ORAL | Status: AC | PRN
  Administered 2024-02-16: 5 mg via ORAL

## 2024-02-16 MED ORDER — POVIDONE-IODINE 10 % EX SWAB
2.0000 | Freq: Once | CUTANEOUS | Status: AC
  Administered 2024-02-16: 2 via TOPICAL

## 2024-02-16 MED ORDER — OXYCODONE HCL 5 MG PO TABS: 5.0000 mg | ORAL_TABLET | ORAL | Status: DC | PRN

## 2024-02-16 MED ORDER — ONDANSETRON HCL 4 MG PO TABS: 4.0000 mg | ORAL_TABLET | Freq: Four times a day (QID) | ORAL | Status: DC | PRN

## 2024-02-16 MED ORDER — PROPOFOL 10 MG/ML IV BOLUS
INTRAVENOUS | Status: DC | PRN
  Administered 2024-02-16: 130 mg via INTRAVENOUS

## 2024-02-16 MED ORDER — METRONIDAZOLE 500 MG/100ML IV SOLN
500.0000 mg | INTRAVENOUS | Status: AC
  Administered 2024-02-16: 500 mg via INTRAVENOUS

## 2024-02-16 MED ORDER — ACETAMINOPHEN 500 MG PO TABS
1000.0000 mg | ORAL_TABLET | ORAL | Status: AC
  Administered 2024-02-16: 1000 mg via ORAL

## 2024-02-16 MED ORDER — ONDANSETRON HCL 4 MG/2ML IJ SOLN
INTRAMUSCULAR | Status: DC | PRN
  Administered 2024-02-16: 4 mg via INTRAVENOUS

## 2024-02-16 MED ORDER — FENTANYL CITRATE (PF) 250 MCG/5ML IJ SOLN
INTRAMUSCULAR | Status: DC | PRN
  Administered 2024-02-16: 100 ug via INTRAVENOUS
  Administered 2024-02-16: 50 ug via INTRAVENOUS

## 2024-02-16 MED ORDER — BUPIVACAINE HCL (PF) 0.5 % IJ SOLN
INTRAMUSCULAR | Status: AC
  Filled 2024-02-16: qty 30

## 2024-02-16 MED ORDER — ACETAMINOPHEN 325 MG PO TABS: 650.0000 mg | ORAL_TABLET | ORAL | Status: DC | PRN

## 2024-02-16 MED ORDER — ONDANSETRON HCL 4 MG/2ML IJ SOLN
INTRAMUSCULAR | Status: AC
  Filled 2024-02-16: qty 2

## 2024-02-16 MED ORDER — ACETAMINOPHEN 500 MG PO TABS
ORAL_TABLET | ORAL | Status: DC
Start: 2024-02-16 — End: 2024-02-16
  Filled 2024-02-16: qty 2

## 2024-02-16 MED ORDER — BUPIVACAINE HCL 0.5 % IJ SOLN
INTRAMUSCULAR | Status: AC
  Filled 2024-02-16: qty 1

## 2024-02-16 MED ORDER — ROCURONIUM BROMIDE 10 MG/ML (PF) SYRINGE
PREFILLED_SYRINGE | INTRAVENOUS | Status: DC | PRN
  Administered 2024-02-16: 20 mg via INTRAVENOUS
  Administered 2024-02-16: 50 mg via INTRAVENOUS

## 2024-02-16 MED ORDER — BUPIVACAINE HCL (PF) 0.5 % IJ SOLN
INTRAMUSCULAR | Status: DC | PRN
Start: 2024-02-16 — End: 2024-02-16
  Administered 2024-02-16: 20 mL

## 2024-02-16 MED ORDER — LIDOCAINE 2% (20 MG/ML) 5 ML SYRINGE
INTRAMUSCULAR | Status: AC
  Filled 2024-02-16: qty 5

## 2024-02-16 MED ORDER — CHLORHEXIDINE GLUCONATE 0.12 % MT SOLN
15.0000 mL | Freq: Once | OROMUCOSAL | Status: AC
  Administered 2024-02-16: 15 mL via OROMUCOSAL

## 2024-02-16 MED ORDER — DEXAMETHASONE SODIUM PHOSPHATE 10 MG/ML IJ SOLN
INTRAMUSCULAR | Status: DC | PRN
  Administered 2024-02-16: 10 mg via INTRAVENOUS

## 2024-02-16 MED ORDER — 0.9 % SODIUM CHLORIDE (POUR BTL) OPTIME
TOPICAL | Status: DC | PRN
Start: 2024-02-16 — End: 2024-02-16
  Administered 2024-02-16: 1000 mL

## 2024-02-16 MED ORDER — DEXAMETHASONE SODIUM PHOSPHATE 10 MG/ML IJ SOLN
INTRAMUSCULAR | Status: AC
  Filled 2024-02-16: qty 1

## 2024-02-16 MED ORDER — CHLORHEXIDINE GLUCONATE 0.12 % MT SOLN
OROMUCOSAL | Status: AC
  Filled 2024-02-16: qty 15

## 2024-02-16 MED ORDER — OXYCODONE HCL 5 MG PO TABS
ORAL_TABLET | ORAL | Status: AC
  Filled 2024-02-16: qty 1

## 2024-02-16 MED ORDER — SODIUM CHLORIDE 0.9 % IV SOLN
INTRAVENOUS | Status: DC | PRN
  Administered 2024-02-16: 1000 mL

## 2024-02-16 MED ORDER — EPHEDRINE 5 MG/ML INJ
INTRAVENOUS | Status: AC
  Filled 2024-02-16: qty 5

## 2024-02-16 MED ORDER — SCOPOLAMINE 1 MG/3DAYS TD PT72
1.0000 | MEDICATED_PATCH | TRANSDERMAL | Status: DC
  Administered 2024-02-16: 1.5 mg via TRANSDERMAL

## 2024-02-16 MED ORDER — LIDOCAINE 2% (20 MG/ML) 5 ML SYRINGE
INTRAMUSCULAR | Status: DC | PRN
  Administered 2024-02-16: 40 mg via INTRAVENOUS

## 2024-02-16 MED ORDER — ACETAMINOPHEN 10 MG/ML IV SOLN: 1000.0000 mg | Freq: Once | INTRAVENOUS | Status: DC | PRN

## 2024-02-16 MED ORDER — SODIUM CHLORIDE 0.9 % IV SOLN
2.0000 g | INTRAVENOUS | Status: AC
  Administered 2024-02-16: 2 g via INTRAVENOUS

## 2024-02-16 MED ORDER — PANTOPRAZOLE SODIUM 40 MG IV SOLR: 40.0000 mg | Freq: Every day | INTRAVENOUS | Status: DC

## 2024-02-16 MED ORDER — MIDAZOLAM HCL 2 MG/2ML IJ SOLN
INTRAMUSCULAR | Status: AC
  Filled 2024-02-16: qty 2

## 2024-02-16 MED ORDER — ORAL CARE MOUTH RINSE: 15.0000 mL | Freq: Once | OROMUCOSAL | Status: AC

## 2024-02-16 MED ORDER — ONDANSETRON HCL 4 MG/2ML IJ SOLN: 4.0000 mg | Freq: Four times a day (QID) | INTRAMUSCULAR | Status: DC | PRN

## 2024-02-16 MED ORDER — DEXMEDETOMIDINE HCL IN NACL 80 MCG/20ML IV SOLN
INTRAVENOUS | Status: DC | PRN
  Administered 2024-02-16: 8 ug via INTRAVENOUS

## 2024-02-16 SURGICAL SUPPLY — 53 items
APPLICATOR ARISTA FLEXITIP XL (MISCELLANEOUS) IMPLANT
BARRIER ADHS 3X4 INTERCEED (GAUZE/BANDAGES/DRESSINGS) IMPLANT
COVER BACK TABLE 60X90IN (DRAPES) ×2 IMPLANT
COVER MAYO STAND STRL (DRAPES) ×2 IMPLANT
COVER TIP SHEARS 8 DVNC (MISCELLANEOUS) ×2 IMPLANT
DEFOGGER SCOPE WARM SEASHARP (MISCELLANEOUS) ×2 IMPLANT
DERMABOND ADVANCED .7 DNX12 (GAUZE/BANDAGES/DRESSINGS) ×2 IMPLANT
DRAPE ARM DVNC X/XI (DISPOSABLE) ×8 IMPLANT
DRAPE COLUMN DVNC XI (DISPOSABLE) ×2 IMPLANT
DRAPE SURG IRRIG POUCH 19X23 (DRAPES) ×2 IMPLANT
DRAPE UTILITY XL STRL (DRAPES) ×2 IMPLANT
DRIVER NDL MEGA SUTCUT DVNCXI (INSTRUMENTS) ×2 IMPLANT
DRIVER NDLE MEGA SUTCUT DVNCXI (INSTRUMENTS) ×2 IMPLANT
DURAPREP 26ML APPLICATOR (WOUND CARE) ×2 IMPLANT
ELECTRODE REM PT RTRN 9FT ADLT (ELECTROSURGICAL) ×2 IMPLANT
FORCEPS PROGRASP DVNC XI (FORCEP) ×2 IMPLANT
GAUZE 4X4 16PLY ~~LOC~~+RFID DBL (SPONGE) IMPLANT
GLOVE BIOGEL PI IND STRL 7.0 (GLOVE) ×4 IMPLANT
GLOVE NEODERM STER SZ 7 (GLOVE) ×6 IMPLANT
GOWN STRL REUS W/TWL LRG LVL3 (GOWN DISPOSABLE) ×2 IMPLANT
HEMOSTAT ARISTA ABSORB 3G PWDR (HEMOSTASIS) IMPLANT
HOLDER FOLEY CATH W/STRAP (MISCELLANEOUS) IMPLANT
IRRIGATION SUCT STRKRFLW 2 WTP (MISCELLANEOUS) ×2 IMPLANT
KIT PINK PAD W/HEAD ARE REST (MISCELLANEOUS) ×2 IMPLANT
KIT PINK PAD W/HEAD ARM REST (MISCELLANEOUS) ×2 IMPLANT
KIT TURNOVER KIT B (KITS) ×2 IMPLANT
LEGGING LITHOTOMY PAIR STRL (DRAPES) ×2 IMPLANT
MANIFOLD NEPTUNE II (INSTRUMENTS) ×2 IMPLANT
NS IRRIG 1000ML POUR BTL (IV SOLUTION) ×2 IMPLANT
OBTURATOR OPTICALSTD 8 DVNC (TROCAR) ×2 IMPLANT
OCCLUDER COLPOPNEUMO (BALLOONS) IMPLANT
PACK CYSTO (CUSTOM PROCEDURE TRAY) ×2 IMPLANT
PACK ROBOT WH (CUSTOM PROCEDURE TRAY) ×2 IMPLANT
PACK ROBOTIC GOWN (GOWN DISPOSABLE) ×2 IMPLANT
PAD OB MATERNITY 11 LF (PERSONAL CARE ITEMS) ×2 IMPLANT
RUMI II 3.0CM BLUE KOH-EFFICIE (DISPOSABLE) IMPLANT
RUMI II GYRUS 2.5CM BLUE (DISPOSABLE) IMPLANT
RUMI II GYRUS 3.5CM BLUE (DISPOSABLE) IMPLANT
RUMI II GYRUS 4.0CM BLUE (DISPOSABLE) IMPLANT
SCISSORS MNPLR CVD DVNC XI (INSTRUMENTS) ×2 IMPLANT
SEAL UNIV 5-12 XI (MISCELLANEOUS) ×6 IMPLANT
SEALER VESSEL EXT DVNC XI (MISCELLANEOUS) IMPLANT
SET CYSTO W/LG BORE CLAMP LF (SET/KITS/TRAYS/PACK) ×2 IMPLANT
SET TUBE SMOKE EVAC HIGH FLOW (TUBING) ×2 IMPLANT
SLEEVE SCD COMPRESS KNEE MED (STOCKING) ×2 IMPLANT
SPIKE FLUID TRANSFER (MISCELLANEOUS) ×2 IMPLANT
SUT MNCRL AB 4-0 PS2 18 (SUTURE) ×2 IMPLANT
SUT VLOC 180 0 9IN GS21 (SUTURE) ×4 IMPLANT
TIP UTERINE 6.7X10CM GRN DISP (MISCELLANEOUS) IMPLANT
TIP UTERINE 6.7X8CM BLUE DISP (MISCELLANEOUS) IMPLANT
TOWEL GREEN STERILE (TOWEL DISPOSABLE) ×2 IMPLANT
TRAY FOLEY W/BAG SLVR 14FR (SET/KITS/TRAYS/PACK) ×2 IMPLANT
UNDERPAD 30X36 HEAVY ABSORB (UNDERPADS AND DIAPERS) ×2 IMPLANT

## 2024-02-16 NOTE — Anesthesia Preprocedure Evaluation (Addendum)
 Anesthesia Evaluation  Patient identified by MRN, date of birth, ID band Patient awake    Reviewed: Allergy & Precautions, NPO status , Patient's Chart, lab work & pertinent test results  Airway Mallampati: I  TM Distance: >3 FB Neck ROM: Full    Dental  (+) Teeth Intact, Dental Advisory Given   Pulmonary former smoker   breath sounds clear to auscultation       Cardiovascular negative cardio ROS  Rhythm:Regular Rate:Normal     Neuro/Psych negative neurological ROS  negative psych ROS   GI/Hepatic negative GI ROS, Neg liver ROS,,,  Endo/Other  Hypothyroidism    Renal/GU negative Renal ROS     Musculoskeletal negative musculoskeletal ROS (+)    Abdominal   Peds  Hematology  (+) Blood dyscrasia, anemia   Anesthesia Other Findings   Reproductive/Obstetrics                             Anesthesia Physical Anesthesia Plan  ASA: 2  Anesthesia Plan: General   Post-op Pain Management: Tylenol  PO (pre-op)* and Toradol  IV (intra-op)*   Induction: Intravenous  PONV Risk Score and Plan: 4 or greater and Ondansetron , Dexamethasone , Midazolam and Scopolamine patch - Pre-op  Airway Management Planned: Oral ETT  Additional Equipment: None  Intra-op Plan:   Post-operative Plan: Extubation in OR  Informed Consent: I have reviewed the patients History and Physical, chart, labs and discussed the procedure including the risks, benefits and alternatives for the proposed anesthesia with the patient or authorized representative who has indicated his/her understanding and acceptance.     Dental advisory given  Plan Discussed with: CRNA  Anesthesia Plan Comments: (- 2 IV's)       Anesthesia Quick Evaluation

## 2024-02-16 NOTE — Transfer of Care (Signed)
 Immediate Anesthesia Transfer of Care Note  Patient: Susan Schmitt  Procedure(s) Performed: HYSTERECTOMY, TOTAL, LAPAROSCOPIC, ROBOT-ASSISTED WITH SALPINGECTOMY (Bilateral: Abdomen) CYSTOSCOPY (Bladder)  Patient Location: PACU  Anesthesia Type:General  Level of Consciousness: drowsy  Airway & Oxygen Therapy: Patient Spontanous Breathing and Patient connected to nasal cannula oxygen  Post-op Assessment: Report given to RN and Post -op Vital signs reviewed and stable  Post vital signs: Reviewed and stable  Last Vitals:  Vitals Value Taken Time  BP 120/81 02/16/24 1433  Temp 97.6   Pulse 59 02/16/24 1435  Resp 16 02/16/24 1435  SpO2 100 % 02/16/24 1435  Vitals shown include unfiled device data.  Last Pain:  Vitals:   02/16/24 1030  TempSrc: Oral  PainSc: 0-No pain      Patients Stated Pain Goal: 6 (02/16/24 1030)  Complications: No notable events documented.

## 2024-02-16 NOTE — Interval H&P Note (Signed)
 Patient seen and examined  No changes in H&P Procedure verified  All questions answered Patient is ready for surgery  Height 5\' 5"  (1.651 m), weight 74.8 kg.  CBC    Component Value Date/Time   WBC 7.5 02/15/2024 0845   RBC 3.77 (L) 02/15/2024 0845   HGB 11.9 (L) 02/15/2024 0845   HCT 36.8 02/15/2024 0845   PLT 219 02/15/2024 0845   MCV 97.6 02/15/2024 0845   MCH 31.6 02/15/2024 0845   MCHC 32.3 02/15/2024 0845   RDW 12.8 02/15/2024 0845   LYMPHSABS 1.9 10/26/2023 0850   MONOABS 0.4 10/26/2023 0850   EOSABS 0.3 10/26/2023 0850   BASOSABS 0.0 10/26/2023 0850   Dr. Tia Flowers

## 2024-02-17 ENCOUNTER — Encounter (HOSPITAL_COMMUNITY): Payer: Self-pay | Admitting: Obstetrics and Gynecology

## 2024-02-17 NOTE — Anesthesia Postprocedure Evaluation (Signed)
 Anesthesia Post Note  Patient: Susan Schmitt  Procedure(s) Performed: HYSTERECTOMY, TOTAL, LAPAROSCOPIC, ROBOT-ASSISTED WITH SALPINGECTOMY (Bilateral: Abdomen) CYSTOSCOPY (Bladder)     Patient location during evaluation: PACU Anesthesia Type: General Level of consciousness: awake and alert Pain management: pain level controlled Vital Signs Assessment: post-procedure vital signs reviewed and stable Respiratory status: spontaneous breathing, nonlabored ventilation, respiratory function stable and patient connected to nasal cannula oxygen Cardiovascular status: blood pressure returned to baseline and stable Postop Assessment: no apparent nausea or vomiting Anesthetic complications: no   No notable events documented.  Last Vitals:  Vitals:   02/16/24 1500 02/16/24 1545  BP:  123/78  Pulse:  62  Resp:  16  Temp: 36.5 C 36.7 C  SpO2:  100%    Last Pain:  Vitals:   02/16/24 1500  TempSrc:   PainSc: 0-No pain                 Willian Harrow

## 2024-02-17 NOTE — Op Note (Signed)
 02/17/2024  [id number] Susan Schmitt        OPERATIVE REPORT   Preop Diagnosis: menorrhagia, dysmenorrhea, anemia, fibroids  Procedure: robotic hysterectomy, bilateral salpingectomy, cystoscopy   Surgeon: Dr. Arvell Birchwood Adana Marik Assistant: Bernadette Brewster, RN   Fluids: please see anesthesia report   Complications: None Anesthesia: General     Findings:  boggy contour 8cm uterus, normal ovaries, evidence of prior tubal ligation Cystoscopy at the end of the case with normal bladder and patent ureters bilaterally.   Estimated blood loss: Minimal <15cc   Specimens: Uterus, cervix and bilateral fallopian tube remnants    Disposition of specimen: Pathology           Patient is taken to the operating room. She is placed in the supine position. She is a running IV in place. Informed consent was present on the chart. SCDs on her lower extremities and functioning properly. Patient was positioned while she was awake.  Her legs were placed in the low lithotomy position in [place] stirrups. Her arms were tucked by the side.  General endotracheal anesthesia was administered by the anesthesia staff without difficulty.       Dura prep was then used to prep the abdomen and Hibiclens was used to prep the inner thighs, perineum and vagina. Once 3 minutes had past the patient was draped in a normal standard fashion. A proper time out was performed and everyone agreed.  The legs were lifted to the high lithotomy position. A bivalve speculum was inserted into the vagina and the anterior lip of the cervix was grasped with single-tooth tenaculum.  The uterus sounded to 7 cm. Pratt dilators were used to dilate the cervix.  The RUMI uterine manipulator was obtained inserted into the endometrial cavity and the bulb of the disposable tip was inflated with 8 cc of normal saline. There was a good fit of the KOH ring around the cervix. The tenaculum and bivavle speculum was removed. There is also good  manipulation of the uterus.  A Foley catheter was placed to straight drain.  Clear urine was noted. Legs were lowered to the low lithotomy position and attention was turned the abdomen.   Superior to the umbilicus, marcaine 0.25% used to anesthetize the skin.  Using #11 blade, 8mm skin incision was made.  The 8mm robotic trocar and sleeve was inserted under direct visualization.  CO2 gas was  started and patient was placed in trendelenburg position.  Two additional 8mm ports were placed under direct visualization in the left and right lower quadrant.     Ureters were identifies.  Attention was turned to the left side. The left tube was elevated and the mesosalpinx was desiccated with the vessel sealer.  The left uterine ovarian pedicle was serially clamped cauterized and incised. Left round ligament was serially clamped cauterized and incised. The anterior and posterior peritoneum of the inferior leaf of the broad ligament were opened. The beginning of the bladder flap was created.  The bladder was taken down below the level of the KOH ring. The left uterine artery skeletonized and then just superior to the KOH ring this vessel was serially clamped, cauterized, and incised.   Attention was turned the right side.  The uterus was placed on stretch to the opposite side.    The mesosalpinx was incised freeing the tube. Then the right uterine ovarian pedicle was serially clamped cauterized and incised. Next the right round ligament was serially clamped cauterized and incised. The anterior posterior peritoneum of  the inferiorly for the broad ligament were opened. The anterior peritoneum was carried across to the dissection on the left side. The remainder of the bladder flap was created using sharp dissection. The bladder was well below the level of the KOH ring. The right uterine artery skeletonized. Then the right uterine artery, above the level of the KOH ring, was serially clamped cauterized and incised. The  uterus was devascularized at this point.   The colpotomy was performed.  This was carried around a circumferential fashion until the vaginal mucosa was completely incised in the specimen was freed.  The specimen was then delivered to the vagina intact.  A vaginal occlusive device was used to maintain the pneumoperitoneum   Instruments were changed with a needle driver and prograsp.  Using a 9 inch  zero V-lock suture, the cuff was closed by incorporating the anterior and posterior vaginal mucosa in each stitch. This was carried across all the way to the left corner and a running fashion. Two stitches were brought back towards the midline and the suture was cut flush with the vagina. The needle was brought out the pelvis. The pelvis was irrigated. All pedicles were inspected. No bleeding was noted.   Co2 pressures were lowered to 8mm Hg.  Again, no bleeding was noted.  Ureters were noted deep in the pelvis to be peristalsing.  At this point the procedure was completed.  The remaining instruments were removed.  The ports were removed under direct visualization of the laparoscope and the pneumoperitoneum was relieved.   The skin was then closed with subcuticular stitches of 3-0 Vicryl. The skin was cleansed Dermabond was applied. Attention was then turned the vagina and the cuff was inspected. No bleeding was noted.  The Foley catheter was removed.  Cystoscopy was performed.  No sutures or bladder injuries were noted.  Ureters were noted with normal urine jets from each one was seen.  Foley was left out after the cystoscopic fluid was drained and cystoscope removed.  Sponge, lap, needle, instrument counts were correct x2. Patient tolerated the procedure very well. She was awakened from anesthesia, extubated and taken to recovery in stable condition.      Dr. Tia Flowers

## 2024-02-18 ENCOUNTER — Ambulatory Visit: Payer: Self-pay | Admitting: Obstetrics and Gynecology

## 2024-02-18 LAB — SURGICAL PATHOLOGY

## 2024-02-24 DIAGNOSIS — Z419 Encounter for procedure for purposes other than remedying health state, unspecified: Secondary | ICD-10-CM | POA: Diagnosis not present

## 2024-03-01 ENCOUNTER — Ambulatory Visit (INDEPENDENT_AMBULATORY_CARE_PROVIDER_SITE_OTHER): Admitting: Obstetrics and Gynecology

## 2024-03-01 VITALS — BP 114/70 | HR 92 | Wt 163.8 lb

## 2024-03-01 DIAGNOSIS — Z9889 Other specified postprocedural states: Secondary | ICD-10-CM

## 2024-03-01 NOTE — Progress Notes (Signed)
 Patient presents for 2 week postop from [facility], bilateral salpingectomy, cystoscopy. She is doing well. No fevers, VB, dysuria or severe abdominal pain.  BP 114/70 (BP Location: Left Arm, Patient Position: Sitting, Cuff Size: Normal)   Pulse 92   Wt 163 lb 12.8 oz (74.3 kg)   LMP  (LMP Unknown)   SpO2 99%   BMI 27.26 kg/m   Abdomen: incisions I/c/d, NT, ND  A/p PO from [facility] 2 weeks doing well Encouraged no heavy lifting, pushing, pulling greater than 13 lbs for full 6 weeks.  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 Pelvic rest for the entire 10 wks maybe longer based on cuff healing 3. RTC with any concerns or with heavy bleeding, fevers or severe abdominal pain.  Dr. Tia Flowers

## 2024-03-25 DIAGNOSIS — Z419 Encounter for procedure for purposes other than remedying health state, unspecified: Secondary | ICD-10-CM | POA: Diagnosis not present

## 2024-04-24 ENCOUNTER — Encounter: Admitting: Obstetrics and Gynecology

## 2024-04-25 ENCOUNTER — Encounter: Payer: Self-pay | Admitting: Obstetrics and Gynecology

## 2024-04-25 ENCOUNTER — Ambulatory Visit (INDEPENDENT_AMBULATORY_CARE_PROVIDER_SITE_OTHER): Admitting: Obstetrics and Gynecology

## 2024-04-25 VITALS — BP 118/78 | HR 69 | Ht 64.5 in | Wt 160.0 lb

## 2024-04-25 DIAGNOSIS — Z419 Encounter for procedure for purposes other than remedying health state, unspecified: Secondary | ICD-10-CM | POA: Diagnosis not present

## 2024-04-25 DIAGNOSIS — Z09 Encounter for follow-up examination after completed treatment for conditions other than malignant neoplasm: Secondary | ICD-10-CM

## 2024-04-26 NOTE — Progress Notes (Signed)
   Acute Office Visit  Subjective:    Patient ID: Susan Schmitt, female    DOB: [birth year], 48 y.o.   MRN: [id number]   HPI 48 y.o. presents today for Post-op Follow-up (10 week follow up - Sx on 02/16/24 robotic hysterectomy, bilateral salpingectomy, cystoscopy - pt reports she had IC about 2 weeks ago and denies pain or bleeding.) .IC-intercourse and knows she was suppose to wait. Denies any VB or severe pain or LOF after.  No LMP recorded (lmp unknown). Patient has had a hysterectomy.   Denies any VB, fevers or severe abdominal pain  Review of Systems     Objective:    OBGyn Exam  BP 118/78 (BP Location: Left Arm, Patient Position: Sitting, Cuff Size: Normal)   Pulse 69   Ht 5' 4.5 (1.638 m)   Wt 160 lb (72.6 kg)   LMP  (LMP Unknown)   SpO2 98%   BMI 27.04 kg/m  Wt Readings from Last 3 Encounters:  04/25/24 160 lb (72.6 kg)  03/01/24 163 lb 12.8 oz (74.3 kg)  02/16/24 164 lb (74.4 kg)      . SVE: suture seen and could be wiped away with q tip.  Cuff intact. Normal discharge. No tenderness to deep palpation with the q tip  Assessment & Plan:  10 wk PO RLH doing well  Can resume all activities.  Advised if intercourse is painful she will need to stop and wait a week. RTC for annual exams and with any concerns.    Susan Schmitt

## 2024-05-26 DIAGNOSIS — Z419 Encounter for procedure for purposes other than remedying health state, unspecified: Secondary | ICD-10-CM | POA: Diagnosis not present

## 2024-05-31 ENCOUNTER — Other Ambulatory Visit: Payer: Self-pay | Admitting: Family

## 2024-06-28 NOTE — Progress Notes (Unsigned)
 No chief complaint on file.   Susan Schmitt here for URI complaints.  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  Continue to push fluids, practice good hand hygiene, cover mouth when coughing. F/u prn. If starting to experience fevers, shaking, or shortness of breath, seek immediate care. Pt voiced understanding and agreement to the plan.  Harlene LITTIE Jolly, DNP, AGNP-C 06/28/24 4:33 PM

## 2024-06-29 ENCOUNTER — Other Ambulatory Visit (HOSPITAL_BASED_OUTPATIENT_CLINIC_OR_DEPARTMENT_OTHER): Payer: Self-pay

## 2024-06-29 ENCOUNTER — Encounter: Payer: Self-pay | Admitting: Student

## 2024-06-29 ENCOUNTER — Ambulatory Visit (INDEPENDENT_AMBULATORY_CARE_PROVIDER_SITE_OTHER): Admitting: Student

## 2024-06-29 VITALS — BP 102/68 | HR 73 | Temp 98.2°F | Resp 12 | Ht 64.5 in | Wt 163.2 lb

## 2024-06-29 DIAGNOSIS — Z23 Encounter for immunization: Secondary | ICD-10-CM | POA: Diagnosis not present

## 2024-06-29 DIAGNOSIS — J3489 Other specified disorders of nose and nasal sinuses: Secondary | ICD-10-CM | POA: Diagnosis not present

## 2024-06-29 DIAGNOSIS — R0981 Nasal congestion: Secondary | ICD-10-CM | POA: Diagnosis not present

## 2024-06-29 DIAGNOSIS — J069 Acute upper respiratory infection, unspecified: Secondary | ICD-10-CM

## 2024-06-29 LAB — POCT INFLUENZA A/B
Influenza A, POC: NEGATIVE
Influenza B, POC: NEGATIVE

## 2024-06-29 LAB — POC COVID19 BINAXNOW: SARS Coronavirus 2 Ag: NEGATIVE

## 2024-06-29 MED ORDER — GUAIFENESIN ER 600 MG PO TB12
1200.0000 mg | ORAL_TABLET | Freq: Two times a day (BID) | ORAL | 0 refills | Status: DC
  Filled 2024-06-29: qty 20, 5d supply, fill #0
  Filled 2024-07-24: qty 20, 5d supply, fill #1

## 2024-06-29 MED ORDER — FLUTICASONE PROPIONATE 50 MCG/ACT NA SUSP
2.0000 | Freq: Every day | NASAL | 6 refills | Status: AC
  Filled 2024-06-29: qty 16, 30d supply, fill #0
  Filled 2024-07-24: qty 16, 30d supply, fill #1

## 2024-06-29 NOTE — Addendum Note (Signed)
 Addended by: ESTELLE GILLIS D on: 06/29/2024 09:31 AM   Modules accepted: Orders

## 2024-06-29 NOTE — Addendum Note (Signed)
 Addended by: ESTELLE GILLIS D on: 06/29/2024 09:36 AM   Modules accepted: Orders

## 2024-07-25 ENCOUNTER — Other Ambulatory Visit (HOSPITAL_COMMUNITY): Payer: Self-pay

## 2024-07-25 ENCOUNTER — Other Ambulatory Visit: Payer: Self-pay | Admitting: Student

## 2024-07-25 ENCOUNTER — Other Ambulatory Visit: Payer: Self-pay

## 2024-07-25 DIAGNOSIS — R0981 Nasal congestion: Secondary | ICD-10-CM

## 2024-07-25 MED ORDER — GUAIFENESIN ER 600 MG PO TB12
1200.0000 mg | ORAL_TABLET | Freq: Two times a day (BID) | ORAL | 0 refills | Status: AC
  Filled 2024-07-25: qty 30, 8d supply, fill #0

## 2024-07-26 ENCOUNTER — Other Ambulatory Visit: Payer: Self-pay

## 2024-07-26 DIAGNOSIS — Z419 Encounter for procedure for purposes other than remedying health state, unspecified: Secondary | ICD-10-CM | POA: Diagnosis not present

## 2024-07-27 ENCOUNTER — Other Ambulatory Visit: Payer: Self-pay

## 2024-08-01 ENCOUNTER — Other Ambulatory Visit: Payer: Self-pay

## 2024-08-14 ENCOUNTER — Ambulatory Visit: Admitting: Family

## 2024-08-14 ENCOUNTER — Encounter: Payer: Self-pay | Admitting: Family

## 2024-08-14 VITALS — BP 125/87 | HR 63 | Temp 98.2°F | Resp 16 | Ht 64.5 in | Wt 170.0 lb

## 2024-08-14 DIAGNOSIS — A6000 Herpesviral infection of urogenital system, unspecified: Secondary | ICD-10-CM

## 2024-08-14 DIAGNOSIS — Z111 Encounter for screening for respiratory tuberculosis: Secondary | ICD-10-CM | POA: Diagnosis not present

## 2024-08-14 DIAGNOSIS — J309 Allergic rhinitis, unspecified: Secondary | ICD-10-CM | POA: Diagnosis not present

## 2024-08-14 DIAGNOSIS — E890 Postprocedural hypothyroidism: Secondary | ICD-10-CM | POA: Diagnosis not present

## 2024-08-14 DIAGNOSIS — D649 Anemia, unspecified: Secondary | ICD-10-CM | POA: Insufficient documentation

## 2024-08-14 DIAGNOSIS — Z23 Encounter for immunization: Secondary | ICD-10-CM

## 2024-08-14 DIAGNOSIS — I7300 Raynaud's syndrome without gangrene: Secondary | ICD-10-CM | POA: Diagnosis not present

## 2024-08-14 LAB — TSH: TSH: 4.26 u[IU]/mL (ref 0.35–5.50)

## 2024-08-14 LAB — CBC WITH DIFFERENTIAL/PLATELET
Basophils Absolute: 0.1 K/uL (ref 0.0–0.1)
Basophils Relative: 0.9 % (ref 0.0–3.0)
Eosinophils Absolute: 0.3 K/uL (ref 0.0–0.7)
Eosinophils Relative: 4.3 % (ref 0.0–5.0)
HCT: 37.6 % (ref 36.0–46.0)
Hemoglobin: 12.6 g/dL (ref 12.0–15.0)
Lymphocytes Relative: 42.8 % (ref 12.0–46.0)
Lymphs Abs: 3 K/uL (ref 0.7–4.0)
MCHC: 33.5 g/dL (ref 30.0–36.0)
MCV: 95.1 fl (ref 78.0–100.0)
Monocytes Absolute: 0.6 K/uL (ref 0.1–1.0)
Monocytes Relative: 8.1 % (ref 3.0–12.0)
Neutro Abs: 3.1 K/uL (ref 1.4–7.7)
Neutrophils Relative %: 43.9 % (ref 43.0–77.0)
Platelets: 225 K/uL (ref 150.0–400.0)
RBC: 3.95 Mil/uL (ref 3.87–5.11)
RDW: 12.5 % (ref 11.5–15.5)
WBC: 7 K/uL (ref 4.0–10.5)

## 2024-08-14 LAB — B12 AND FOLATE PANEL
Folate: 19.1 ng/mL (ref >5.9)
Vitamin B-12: 357 pg/mL (ref 211–911)

## 2024-08-14 MED ORDER — B COMPLEX VITAMINS PO CAPS: 1.0000 | ORAL_CAPSULE | Freq: Every day | ORAL | Status: AC

## 2024-08-14 NOTE — Patient Instructions (Addendum)
  VISIT SUMMARY: Today, you came in for a follow-up visit to discuss your thyroid  issues, anemia, allergies, menopausal symptoms, and other health concerns. We reviewed your current medications and symptoms, and made some adjustments to your treatment plan.  YOUR PLAN: -POSTPROCEDURAL HYPOTHYROIDISM: Postprocedural hypothyroidism means your thyroid  is underactive following a medical procedure. We are managing this with Synthroid , and we checked your thyroid  function tests today.  -ANEMIA, MILD: Mild anemia means you have a lower than normal number of red blood cells. We suspect this might be due to a B12 deficiency. We ordered iron studies and a B12 level test to investigate further.  -ALLERGIC RHINITIS: Allergic rhinitis is an allergic reaction that causes sneezing, congestion, and a runny nose. You are currently using Mucinex  and Flonase , and we recommended adding Claritin, Allegra, or Zyrtec for additional relief.  -MENOPAUSAL SYMPTOMS: Menopausal symptoms include hot flashes and sweating, which are common during menopause. We discussed these symptoms and their management.  -SCREENING FOR TUBERCULOSIS: We are screening for tuberculosis (TB) due to your boyfriend's night sweats and your recent reaction to a TB test. Your TB test was negative at 72 hours, but we ordered a TB Gold blood test to confirm.  -POSSIBLE RAYNAUD'S PHENOMENON: Raynaud's phenomenon is a condition where some areas of your body, like your fingers, feel numb and cold in response to cold temperatures or stress. We advised you to wear gloves to keep your hands warm.  -GENERAL HEALTH MAINTENANCE: You have not completed the Hepatitis B vaccination series, but you received the flu shot this year. Your mammogram is due in mid-February, and your colonoscopy and tetanus vaccinations are up to date. We administered the first dose of the HEPLISAV-B vaccine today and scheduled the second dose in one month.  INSTRUCTIONS: Please follow  up with the following instructions: 1. Continue taking Synthroid  as prescribed and await the results of your thyroid  function tests. 2. Complete the iron studies and B12 level test as ordered. 3. Add Claritin, Allegra, or Zyrtec to your allergy medication regimen. 4. Monitor your menopausal symptoms and report any changes. 5. Complete the TB Gold blood test as ordered. 6. Wear gloves to keep your hands warm to manage Raynaud's symptoms. 7. Return for the second dose of the HEPLISAV-B vaccine in one month. 8. Schedule your mammogram for mid-February.                                                  Contains text generated by [employer].

## 2024-08-14 NOTE — Assessment & Plan Note (Signed)
 Reports one outbreak only- none since. Monitor.

## 2024-08-14 NOTE — Assessment & Plan Note (Signed)
 Hx most consistent with raynaud's of hands. Recommended that she wear gloves and keep hands warm.

## 2024-08-14 NOTE — Assessment & Plan Note (Signed)
 Uncontrolled on flonase /mucinex . Recommended that she add an otc allergy medication such as claritin prn.

## 2024-08-14 NOTE — Addendum Note (Signed)
 Addended by: WELLS LEVORN HERO on: 08/14/2024 02:05 PM   Modules accepted: Orders

## 2024-08-14 NOTE — Progress Notes (Signed)
 Subjective:     Patient ID: Susan Schmitt, female    DOB: [birth year], 48 y.o.   MRN: [id number]  Chief Complaint  Patient presents with   Transitions Of Care    HPI  Discussed the use of AI scribe software for clinical note transcription with the patient, who gave verbal consent to proceed.  History of Present Illness Susan Schmitt is a 48 year old female who presents for a follow-up visit.  She has a history of thyroid  issues, initially diagnosed with hyperthyroidism in 2005, for which she underwent iodine  ablation and subsequently started on Synthroid . Her current dose is 88 mcg, though it has been adjusted in the past year due to low levels. She does not currently follow with Dr. Ballen, but was previously managed by Leita at the clinic.  She has a history of abnormal Pap smears, which led to a hysterectomy. She also has a history of herpes type 2, with no flare-ups since the initial diagnosis.  She experiences being 'always cold' and has mild anemia, with her last hemoglobin reading at 11.9. She describes a symptom of one finger becoming white and cold, occurring frequently. She has started taking a B12 complex recently.  She underwent a TB test on November 18th, which was negative at 72 hours. However, she experienced itching and a rash at the site a week later.   She is concerned about her boyfriend's night sweats, which have been ongoing since before May, though she states he has tested negative for HIV.  She takes trazodone  as needed for sleep and uses Mucinex  and Flonase  for allergies. She has recently started taking an over-the-counter allergy pill, possibly Claritin, Allegra, or Zyrtec.      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 danis   CYSTOSCOPY N/A 02/16/2024   Procedure: CYSTOSCOPY;  Surgeon: Glennon Almarie POUR, MD;  Location: [facility] OR;  Service: Gynecology;  Laterality: N/A;   FINGER SURGERY Left 2002   repair torn ligaments   HYSTERECTOMY, TOTAL, LAPAROSCOPIC, ROBOT-ASSISTED WITH SALPINGECTOMY Bilateral 02/16/2024   Procedure: HYSTERECTOMY, TOTAL, LAPAROSCOPIC, ROBOT-ASSISTED WITH SALPINGECTOMY;  Surgeon: Glennon Almarie POUR, MD;  Location: [facility] OR;  Service: Gynecology;  Laterality: Bilateral;   METATARSAL OSTEOTOMY WITH BUNIONECTOMY Right 12/12/2019   @[facility]  by dr r. norman   TUBAL LIGATION  12/22/2010   @[facility]  by dr a. stringer;    PPTL    Family History  Problem Relation Age of Onset   COPD Mother    Depression Father    Cancer Brother    Non-Hodgkin's lymphoma Brother  Cancer Maternal Grandfather        prostate   Stomach cancer Paternal Grandmother    Breast cancer Paternal Grandmother    Cancer Paternal Grandmother        stomach   Colon cancer Neg Hx    Colon polyps Neg Hx    Esophageal cancer Neg Hx    Rectal cancer Neg Hx     Social History   Socioeconomic History   Marital status: Single    Spouse name: Not on file   Number of children: Not on file   Years of education: Not on file   Highest education level: Associate degree: occupational, scientist, product/process development, or vocational program  Occupational History   Not on file  Tobacco Use   Smoking status: Former     Current packs/day: 0.00    Types: Cigarettes    Quit date: 01/06/2009    Years since quitting: 15.6   Smokeless tobacco: Never   Tobacco comments:    02-11-2024 pt stated quit 2010,  started age 86  (54)   for 12 yrs  Vaping Use   Vaping status: Never Used  Substance and Sexual Activity   Alcohol use: Not Currently    Comment: occasional   Drug use: Never   Sexual activity: Yes    Partners: Male    Birth control/protection: Surgical, Post-menopausal    Comment: 1st intercourse- 14, partners- 10+, BTL  Other Topics Concern   Not on file  Social History Narrative   Has 3 children, youngest born 2012, 2 adult children   Enjoys exercise   Teaches at [employer]- [employer]   No pets   Has a significant other with whom she lives- previously divorced years ago.    Social Drivers of Health   Financial Resource Strain: Medium Risk (08/12/2024)   Overall Financial Resource Strain (CARDIA)    Difficulty of Paying Living Expenses: Somewhat hard  Food Insecurity: No Food Insecurity (08/12/2024)   Hunger Vital Sign    Worried About Running Out of Food in the Last Year: Never true    Ran Out of Food in the Last Year: Never true  Transportation Needs: No Transportation Needs (08/12/2024)   PRAPARE - Administrator, Civil Service (Medical): No    Lack of Transportation (Non-Medical): No  Physical Activity: Sufficiently Active (08/12/2024)   Exercise Vital Sign    Days of Exercise per Week: 5 days    Minutes of Exercise per Session: 60 min  Stress: Stress Concern Present (08/12/2024)   [employer] of Occupational Health - Occupational Stress Questionnaire    Feeling of Stress: Very much  Social Connections: Socially Integrated (08/12/2024)   Social Connection and Isolation Panel    Frequency of Communication with Friends and Family: Once a week    Frequency of Social Gatherings with Friends and Family: Twice a week    Attends Religious Services: 1 to  4 times per year    Active Member of [employer] or Organizations: Yes    Attends Banker Meetings: 1 to 4 times per year    Marital Status: Living with partner  Intimate Partner Violence: Not on file    Outpatient Medications Prior to Visit  Medication Sig Dispense Refill   fluticasone  (FLONASE ) 50 MCG/ACT nasal spray Place 2 sprays into both nostrils daily. 16 g 6   guaiFENesin  (MUCINEX ) 600 MG 12 hr tablet Take 2 tablets (1,200 mg total) by mouth 2 (two) times daily. 30 tablet 0  levothyroxine  (SYNTHROID ) 88 MCG tablet TAKE 1 TABLET(88 MCG) BY MOUTH DAILY BEFORE BREAKFAST 90 tablet 0   traZODone  (DESYREL ) 50 MG tablet TAKE 1/2 TO 1 TABLET(25 TO 50 MG) BY MOUTH AT BEDTIME AS NEEDED FOR SLEEP 30 tablet 3   No facility-administered medications prior to visit.    No Known Allergies  ROS See HPI    Objective:    Physical Exam Constitutional:      General: She is not in acute distress.    Appearance: Normal appearance. She is well-developed.  HENT:     Head: Normocephalic and atraumatic.     Right Ear: External ear normal.     Left Ear: External ear normal.  Eyes:     General: No scleral icterus. Neck:     Thyroid : No thyromegaly.  Cardiovascular:     Rate and Rhythm: Normal rate and regular rhythm.     Heart sounds: Normal heart sounds. No murmur heard. Pulmonary:     Effort: Pulmonary effort is normal. No respiratory distress.     Breath sounds: Normal breath sounds. No wheezing.  Musculoskeletal:     Cervical back: Neck supple.  Skin:    General: Skin is warm and dry.  Neurological:     Mental Status: She is alert and oriented to person, place, and time.  Psychiatric:        Mood and Affect: Mood normal.        Behavior: Behavior normal.        Thought Content: Thought content normal.        Judgment: Judgment normal.      BP 125/87 (BP Location: Right Arm, Patient Position: Sitting, Cuff Size: Normal)   Pulse 63   Temp 98.2 F (36.8 C) (Oral)    Resp 16   Ht 5' 4.5 (1.638 m)   Wt 170 lb (77.1 kg)   LMP  (LMP Unknown)   SpO2 100%   BMI 28.73 kg/m  Wt Readings from Last 3 Encounters:  08/14/24 170 lb (77.1 kg)  06/29/24 163 lb 3.2 oz (74 kg)  04/25/24 160 lb (72.6 kg)       Assessment & Plan:   Problem List Items Addressed This Visit       Unprioritized   Raynaud's disease without gangrene   Hx most consistent with raynaud's of hands. Recommended that she wear gloves and keep hands warm.       Hypothyroidism, postradioiodine therapy - Primary   Clinically stable on synthroid . Update TSH.       Relevant Orders   TSH   Genital herpes simplex   Reports one outbreak only- none since. Monitor.       Anemia   Mild, macrocytic. Obtain labs as below. She states she is taking a b complex vitamin.       Relevant Orders   IBC panel   B12 and Folate Panel   CBC w/Diff   Allergic rhinitis   Uncontrolled on flonase /mucinex . Recommended that she add an otc allergy medication such as claritin prn.       Other Visit Diagnoses       Screening for tuberculosis       Relevant Orders   QuantiFERON-TB Gold Plus       I am having Susan Schmitt start on b complex vitamins. I am also having her maintain her traZODone , levothyroxine , fluticasone , and guaiFENesin .  Meds ordered this encounter  Medications   b complex vitamins capsule    Sig: Take  1 capsule by mouth daily.    Supervising Provider:   DOMENICA BLACKBIRD A [phone]

## 2024-08-14 NOTE — Assessment & Plan Note (Signed)
 Mild, macrocytic. Obtain labs as below. She states she is taking a b complex vitamin.

## 2024-08-14 NOTE — Assessment & Plan Note (Signed)
Clinically stable on synthroid. Update TSH.

## 2024-08-15 LAB — IBC PANEL
Iron: 86 ug/dL (ref 42–145)
Saturation Ratios: 21.8 % (ref 20.0–50.0)
TIBC: 394.8 ug/dL (ref 250.0–450.0)
Transferrin: 282 mg/dL (ref 212.0–360.0)

## 2024-08-16 ENCOUNTER — Ambulatory Visit: Payer: Self-pay | Admitting: Family

## 2024-08-16 LAB — QUANTIFERON-TB GOLD PLUS
Mitogen-NIL: 7.99 [IU]/mL
NIL: 0.03 [IU]/mL
QuantiFERON-TB Gold Plus: NEGATIVE
TB1-NIL: 0.01 [IU]/mL
TB2-NIL: 0.02 [IU]/mL

## 2024-09-12 ENCOUNTER — Telehealth: Payer: Self-pay

## 2024-09-12 NOTE — Telephone Encounter (Signed)
 Copied from CRM #[id number]. Topic: General - Other >> Sep 12, 2024 10:49 AM Victoria A wrote: Reason for CRM: Patient would like letter sent [employer] [phone] Fax Attention [employer] stating that she had her Hepatis Vaccination on 08/14/24-Please call patient Mobile [phone]

## 2024-09-15 NOTE — Telephone Encounter (Signed)
 Letter posted to patient's MyChart

## 2024-09-15 NOTE — Telephone Encounter (Signed)
OK to send. please

## 2024-09-29 ENCOUNTER — Ambulatory Visit: Admitting: Family

## 2024-09-29 ENCOUNTER — Encounter: Payer: Self-pay | Admitting: Family

## 2024-09-29 VITALS — BP 107/63 | HR 74 | Temp 98.2°F | Resp 16 | Ht 64.5 in | Wt 172.0 lb

## 2024-09-29 DIAGNOSIS — Z23 Encounter for immunization: Secondary | ICD-10-CM

## 2024-09-29 DIAGNOSIS — E039 Hypothyroidism, unspecified: Secondary | ICD-10-CM

## 2024-09-29 DIAGNOSIS — G4700 Insomnia, unspecified: Secondary | ICD-10-CM

## 2024-09-29 DIAGNOSIS — Z0000 Encounter for general adult medical examination without abnormal findings: Secondary | ICD-10-CM

## 2024-09-29 DIAGNOSIS — Z1231 Encounter for screening mammogram for malignant neoplasm of breast: Secondary | ICD-10-CM

## 2024-09-29 DIAGNOSIS — E890 Postprocedural hypothyroidism: Secondary | ICD-10-CM

## 2024-09-29 DIAGNOSIS — J309 Allergic rhinitis, unspecified: Secondary | ICD-10-CM | POA: Diagnosis not present

## 2024-09-29 MED ORDER — TRAZODONE HCL 50 MG PO TABS: 50.0000 mg | ORAL_TABLET | Freq: Every evening | ORAL | 1 refills | Status: AC | PRN

## 2024-09-29 MED ORDER — LEVOTHYROXINE SODIUM 88 MCG PO TABS: 88.0000 ug | ORAL_TABLET | Freq: Every day | ORAL | 1 refills | Status: AC

## 2024-09-29 NOTE — Assessment & Plan Note (Signed)
" °  Managed with fluticasone  and guaifenesin . Nasal congestion symptoms possibly exacerbated by viral infection. - Continue fluticasone  50 mcg/act nasal daily. - Continue guaifenesin  600 mg oral twice daily. "

## 2024-09-29 NOTE — Progress Notes (Signed)
 "  Subjective:     Patient ID: Susan Schmitt, female    DOB: [birth year], 49 y.o.   MRN: [id number]  Chief Complaint  Patient presents with   Annual Exam    HPI  Discussed the use of AI scribe software for clinical note transcription with the patient, who gave verbal consent to proceed.  History of Present Illness Susan Schmitt is a 49 year old female who presents for an annual physical exam and follow-up on her allergies and cold symptoms.  She experiences significant nasal congestion, which she describes as 'out of control.' She has been using Mucinex , Afrin, and Theraflu to manage her symptoms. She is unsure if her symptoms are due to a cold or allergies. She has been taking Claritin for her allergies, which have been acting up recently.  She has a history of hypothyroidism and is currently taking Synthroid . She requests a refill for Synthroid . She also uses trazodone  occasionally to help with sleep due to stress. She is a single mother, and her daughter is involved in competitive gymnastics, which adds to her stress levels.  She reports experiencing headaches about three times a week, which are not severe. She ensures adequate hydration.  Her last colonoscopy was in 2023, and she is due for a mammogram, which was last done on February 13th. She acknowledges needing a vision exam and has had difficulty finding a dentist that accepts Medicaid.  No skin concerns, hearing issues, leg swelling, constipation, diarrhea, urinary issues, or unusual muscle or joint pain. She reports hot flashes but finds them tolerable. She does not use tobacco or vape.   Immunizations: due for hep B #2 Diet:  plans to restart healthy eating. Exercise: not regular Wt Readings from Last 3 Encounters:  09/29/24 172 lb (78 kg)  08/14/24 170 lb (77.1 kg)  06/29/24 163 lb 3.2 oz (74 kg)    Colonoscopy: 2023 Pap Smear: 3/25 Mammogram:due in February Vision: due Dental: due      There are no  preventive care reminders to display for this patient.   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 danis   CYSTOSCOPY N/A 02/16/2024   Procedure: CYSTOSCOPY;  Surgeon: Glennon Almarie POUR, MD;  Location: [facility] OR;  Service: Gynecology;  Laterality: N/A;   FINGER SURGERY Left 2002   repair torn ligaments   HYSTERECTOMY, TOTAL, LAPAROSCOPIC, ROBOT-ASSISTED WITH SALPINGECTOMY Bilateral 02/16/2024   Procedure: HYSTERECTOMY, TOTAL, LAPAROSCOPIC, ROBOT-ASSISTED WITH SALPINGECTOMY;  Surgeon: Glennon Almarie POUR, MD;  Location: [facility] OR;  Service: Gynecology;  Laterality: Bilateral;   METATARSAL OSTEOTOMY WITH BUNIONECTOMY Right 12/12/2019   @[facility]  by dr r. norman   TUBAL LIGATION  12/22/2010   @[facility]  by dr a. stringer;    PPTL  Family History  Problem Relation Age of Onset   COPD Mother    Depression Father    Cancer Brother    Non-Hodgkin's lymphoma Brother    Cancer Maternal Grandfather        prostate   Stomach cancer Paternal Grandmother    Breast cancer Paternal Grandmother    Cancer Paternal Grandmother        stomach   Colon cancer Neg Hx    Colon polyps Neg Hx    Esophageal cancer Neg Hx    Rectal cancer Neg Hx     Social History   Socioeconomic History   Marital status: Single    Spouse name: Not on file   Number of children: Not on  file   Years of education: Not on file   Highest education level: Associate degree: occupational, scientist, product/process development, or vocational program  Occupational History   Not on file  Tobacco Use   Smoking status: Former    Current packs/day: 0.00    Types: Cigarettes    Quit date: 01/06/2009    Years since quitting: 15.7   Smokeless tobacco: Never   Tobacco comments:    02-11-2024 pt stated quit 2010,  started age 34  (13)   for 12 yrs  Vaping Use   Vaping status: Never Used  Substance and Sexual Activity   Alcohol use: Not Currently    Comment: occasional   Drug use: Never   Sexual activity: Yes    Partners: Male    Birth control/protection: Surgical, Post-menopausal    Comment: 1st intercourse- 14, partners- 10+, BTL  Other Topics Concern   Not on file  Social History Narrative   Has 3 children, youngest born 2012, 2 adult children   Enjoys exercise   Teaches at [employer]   No pets   Has a significant other with whom she lives- previously divorced years ago.    Social Drivers of Health   Tobacco Use: Medium Risk (09/29/2024)   Patient History    Smoking Tobacco Use: Former    Smokeless Tobacco Use: Never    Passive Exposure: Not on file  Financial Resource Strain: Medium Risk (08/12/2024)   Overall Financial Resource Strain (CARDIA)    Difficulty of Paying Living Expenses: Somewhat hard  Food Insecurity: No Food Insecurity (08/12/2024)   Epic    Worried About Programme Researcher, Broadcasting/film/video in the Last Year: Never true    Ran Out of Food in the Last Year: Never true  Transportation Needs: No Transportation Needs (08/12/2024)   Epic    Lack of Transportation (Medical): No    Lack of Transportation (Non-Medical): No  Physical Activity: Sufficiently Active (08/12/2024)   Exercise Vital Sign    Days of Exercise per Week: 5 days    Minutes of Exercise per Session: 60 min  Stress: Stress Concern Present (08/12/2024)   [employer] of Occupational Health -  Occupational Stress Questionnaire    Feeling of Stress: Very much  Social Connections: Socially Integrated (08/12/2024)   Social Connection and Isolation Panel    Frequency of Communication with Friends and Family: Once a week    Frequency of Social Gatherings with Friends and Family: Twice a week    Attends Religious Services: 1 to 4 times per year    Active Member of [employer] or Organizations: Yes    Attends Banker Meetings: 1 to 4 times per year    Marital Status: Living with partner  Intimate Partner Violence:  Not on file  Depression (PHQ2-9): Medium Risk (09/29/2024)   Depression (PHQ2-9)    PHQ-2 Score: 10  Alcohol Screen: Low Risk (08/12/2024)   Alcohol Screen    Last Alcohol Screening Score (AUDIT): 1  Housing: Low Risk (08/12/2024)   Epic    Unable to Pay for Housing in the Last Year: No    Number of Times Moved in the Last Year: 1    Homeless in the Last Year: No  Utilities: Not on file  Health Literacy: Not on file    Outpatient Medications Prior to Visit  Medication Sig Dispense Refill   b complex vitamins capsule Take 1 capsule by mouth daily.     fluticasone  (FLONASE ) 50 MCG/ACT nasal spray Place 2 sprays into both nostrils daily. 16 g 6   guaiFENesin  (MUCINEX ) 600 MG 12 hr tablet Take 2 tablets (1,200 mg total) by mouth 2 (two) times daily. 30 tablet 0   levothyroxine  (SYNTHROID ) 88 MCG tablet TAKE 1 TABLET(88 MCG) BY MOUTH DAILY BEFORE BREAKFAST 90 tablet 0   traZODone  (DESYREL ) 50 MG tablet TAKE 1/2 TO 1 TABLET(25 TO 50 MG) BY MOUTH AT BEDTIME AS NEEDED FOR SLEEP 30 tablet 3   No facility-administered medications prior to visit.    Allergies[1]  Review of Systems  Constitutional:  Negative for weight loss.  HENT:  Positive for congestion. Negative for hearing loss.   Eyes:  Negative for blurred vision.  Respiratory:  Negative for cough.   Cardiovascular:  Negative for leg swelling.  Gastrointestinal:  Negative for constipation and diarrhea.   Genitourinary:  Negative for dysuria and frequency.  Musculoskeletal:  Negative for joint pain and myalgias.  Skin:  Negative for rash.  Neurological:  Positive for headaches (about 3 mild headaches/week).  Psychiatric/Behavioral:  Positive for depression (attributes to stress). The patient is not nervous/anxious.        Objective:    Physical Exam   BP 107/63 (BP Location: Right Arm, Patient Position: Sitting, Cuff Size: Normal)   Pulse 74   Temp 98.2 F (36.8 C) (Oral)   Resp 16   Ht 5' 4.5 (1.638 m)   Wt 172 lb (78 kg)   LMP  (LMP Unknown)   SpO2 100%   BMI 29.07 kg/m  Wt Readings from Last 3 Encounters:  09/29/24 172 lb (78 kg)  08/14/24 170 lb (77.1 kg)  06/29/24 163 lb 3.2 oz (74 kg)   Lab Results  Component Value Date   CHOL 172 02/23/2022   HDL 55.80 02/23/2022   LDLCALC 98 02/23/2022   TRIG 88.0 02/23/2022   CHOLHDL 3 02/23/2022   Physical Exam  Constitutional: She is oriented to person, place, and time. She appears well-developed and well-nourished. No distress.  HENT:  Head: Normocephalic and atraumatic.  Right Ear: Tympanic membrane and ear canal normal.  Left Ear: Tympanic membrane and ear canal normal.  Mouth/Throat: Oropharynx is clear and moist.  Eyes: Pupils are equal, round, and reactive to light. No scleral icterus.  Neck: Normal range of motion. No thyromegaly present.  Cardiovascular: Normal rate and regular rhythm.   No murmur heard. Pulmonary/Chest: Effort normal and breath sounds normal. No respiratory distress. He has no wheezes. She has no rales. She exhibits no tenderness.  Abdominal: Soft. Bowel sounds are normal. She exhibits no distension and no mass. There is no tenderness. There is no rebound and no guarding.  Musculoskeletal: She exhibits no edema.  Lymphadenopathy:    She has no cervical adenopathy.  Neurological:  She is alert and oriented to person, place, and time. She has normal patellar reflexes. She exhibits normal muscle  tone. Coordination normal.  Skin: Skin is warm and dry.  Psychiatric: She has a normal mood and affect. Her behavior is normal. Judgment and thought content normal.  Breast/Pelvic: deferred Assessment & Plan:       Assessment & Plan:   Problem List Items Addressed This Visit       Unprioritized   Preventative health care - Primary    Routine wellness visit with focus on weight management, stress, and lifestyle changes. Discussed menopause symptoms and stress-related headaches. Depression screening showed no significant concern. Recent labs normal. - Administered second dose of hepatitis B vaccine. - Ordered cholesterol, blood sugar, and kidney function tests. - Ordered mammogram. - Encouraged scheduling vision exam. - Encouraged finding a dentist that accepts current insurance. - Will repeat thyroid  function test in six months unless symptoms worsen.      Relevant Orders   Lipid panel   Comp Met (CMET)   Insomnia    Exacerbated by stress. Trazodone  used as needed for sleep. - Refilled trazodone  50 mg oral at bedtime as needed, 90 tablets.       Relevant Medications   traZODone  (DESYREL ) 50 MG tablet   Hypothyroidism, postradioiodine therapy    Well-managed with levothyroxine . Recent thyroid  function tests normal. - Continue levothyroxine  88 mcg oral daily. - Will repeat thyroid  function test in six months unless symptoms worsen.      Relevant Medications   levothyroxine  (SYNTHROID ) 88 MCG tablet   Hypothyroidism   Lab Results  Component Value Date   TSH 4.26 08/14/2024   Continue synthroid .       Relevant Medications   levothyroxine  (SYNTHROID ) 88 MCG tablet   Allergic rhinitis    Managed with fluticasone  and guaifenesin . Nasal congestion symptoms possibly exacerbated by viral infection. - Continue fluticasone  50 mcg/act nasal daily. - Continue guaifenesin  600 mg oral twice daily.      Other Visit Diagnoses       Breast cancer screening by mammogram        Relevant Orders   MM 3D SCREENING MAMMOGRAM BILATERAL BREAST     Need for hepatitis B vaccination       Relevant Orders   Heplisav-B  (HepB-CPG) Vaccine (Completed)       Assessment & Plan    I have changed Chesley L. Ybarbo's levothyroxine  and traZODone . I am also having her maintain her fluticasone , guaiFENesin , and b complex vitamins.  Meds ordered this encounter  Medications   levothyroxine  (SYNTHROID ) 88 MCG tablet    Sig: Take 1 tablet (88 mcg total) by mouth daily before breakfast.    Dispense:  90 tablet    Refill:  1    Supervising Provider:   DOMENICA BLACKBIRD A [[id number]]   traZODone  (DESYREL ) 50 MG tablet    Sig: Take 1 tablet (50 mg total) by mouth at bedtime as needed for sleep.    Dispense:  90 tablet    Refill:  1    Supervising Provider:   DOMENICA BLACKBIRD A [[id number]]      [1] No Known Allergies  "

## 2024-09-29 NOTE — Assessment & Plan Note (Signed)
 Lab Results  Component Value Date   TSH 4.26 08/14/2024   Continue synthroid .

## 2024-09-29 NOTE — Assessment & Plan Note (Signed)
" °  Well-managed with levothyroxine . Recent thyroid  function tests normal. - Continue levothyroxine  88 mcg oral daily. - Will repeat thyroid  function test in six months unless symptoms worsen. "

## 2024-09-29 NOTE — Assessment & Plan Note (Signed)
" °  Routine wellness visit with focus on weight management, stress, and lifestyle changes. Discussed menopause symptoms and stress-related headaches. Depression screening showed no significant concern. Recent labs normal. - Administered second dose of hepatitis B vaccine. - Ordered cholesterol, blood sugar, and kidney function tests. - Ordered mammogram. - Encouraged scheduling vision exam. - Encouraged finding a dentist that accepts current insurance. - Will repeat thyroid  function test in six months unless symptoms worsen. "

## 2024-09-29 NOTE — Assessment & Plan Note (Signed)
" °  Exacerbated by stress. Trazodone  used as needed for sleep. - Refilled trazodone  50 mg oral at bedtime as needed, 90 tablets.  "

## 2024-09-30 LAB — COMPREHENSIVE METABOLIC PANEL WITH GFR
ALT: 19 IU/L (ref 0–32)
AST: 19 IU/L (ref 0–40)
Albumin: 4.4 g/dL (ref 3.9–4.9)
Alkaline Phosphatase: 79 IU/L (ref 41–116)
BUN/Creatinine Ratio: 15 (ref 9–23)
BUN: 12 mg/dL (ref 6–24)
Bilirubin Total: 0.4 mg/dL (ref 0.0–1.2)
CO2: 26 mmol/L (ref 20–29)
Calcium: 9.6 mg/dL (ref 8.7–10.2)
Chloride: 100 mmol/L (ref 96–106)
Creatinine, Ser: 0.82 mg/dL (ref 0.57–1.00)
Globulin, Total: 2.5 g/dL (ref 1.5–4.5)
Glucose: 86 mg/dL (ref 70–99)
Potassium: 4.3 mmol/L (ref 3.5–5.2)
Sodium: 141 mmol/L (ref 134–144)
Total Protein: 6.9 g/dL (ref 6.0–8.5)
eGFR: 88 mL/min/1.73

## 2024-09-30 LAB — LIPID PANEL
Chol/HDL Ratio: 3.6 ratio (ref 0.0–4.4)
Cholesterol, Total: 216 mg/dL — ABNORMAL HIGH (ref 100–199)
HDL: 60 mg/dL
LDL Chol Calc (NIH): 130 mg/dL — ABNORMAL HIGH (ref 0–99)
Triglycerides: 149 mg/dL (ref 0–149)
VLDL Cholesterol Cal: 26 mg/dL (ref 5–40)

## 2024-10-01 ENCOUNTER — Ambulatory Visit: Payer: Self-pay | Admitting: Family

## 2024-11-03 ENCOUNTER — Encounter: Admitting: Family

## 2025-03-30 ENCOUNTER — Other Ambulatory Visit

## 2025-04-30 ENCOUNTER — Ambulatory Visit: Admitting: Obstetrics and Gynecology
